# Patient Record
Sex: Female | Born: 1995 | Race: White | Hispanic: No | Marital: Married | Smoking: Former smoker
Health system: Southern US, Community
[De-identification: ages and names within clinical notes are randomized; demographics above are authoritative.]

## PROBLEM LIST (undated history)

## (undated) DIAGNOSIS — K589 Irritable bowel syndrome without diarrhea: Secondary | ICD-10-CM

## (undated) DIAGNOSIS — F329 Major depressive disorder, single episode, unspecified: Secondary | ICD-10-CM

## (undated) DIAGNOSIS — F909 Attention-deficit hyperactivity disorder, unspecified type: Secondary | ICD-10-CM

## (undated) DIAGNOSIS — G8929 Other chronic pain: Secondary | ICD-10-CM

## (undated) DIAGNOSIS — D649 Anemia, unspecified: Secondary | ICD-10-CM

## (undated) DIAGNOSIS — K219 Gastro-esophageal reflux disease without esophagitis: Secondary | ICD-10-CM

## (undated) DIAGNOSIS — D352 Benign neoplasm of pituitary gland: Secondary | ICD-10-CM

## (undated) DIAGNOSIS — K760 Fatty (change of) liver, not elsewhere classified: Secondary | ICD-10-CM

## (undated) DIAGNOSIS — R5383 Other fatigue: Secondary | ICD-10-CM

## (undated) DIAGNOSIS — E669 Obesity, unspecified: Secondary | ICD-10-CM

## (undated) DIAGNOSIS — I10 Essential (primary) hypertension: Secondary | ICD-10-CM

## (undated) DIAGNOSIS — M549 Dorsalgia, unspecified: Secondary | ICD-10-CM

## (undated) DIAGNOSIS — L509 Urticaria, unspecified: Secondary | ICD-10-CM

## (undated) DIAGNOSIS — F419 Anxiety disorder, unspecified: Secondary | ICD-10-CM

## (undated) DIAGNOSIS — R0602 Shortness of breath: Secondary | ICD-10-CM

## (undated) DIAGNOSIS — R519 Headache, unspecified: Secondary | ICD-10-CM

## (undated) DIAGNOSIS — F17200 Nicotine dependence, unspecified, uncomplicated: Secondary | ICD-10-CM

## (undated) DIAGNOSIS — E559 Vitamin D deficiency, unspecified: Secondary | ICD-10-CM

## (undated) DIAGNOSIS — F32A Depression, unspecified: Secondary | ICD-10-CM

## (undated) HISTORY — DX: Gastro-esophageal reflux disease without esophagitis: K21.9

## (undated) HISTORY — DX: Fatty (change of) liver, not elsewhere classified: K76.0

## (undated) HISTORY — DX: Shortness of breath: R06.02

## (undated) HISTORY — DX: Headache, unspecified: R51.9

## (undated) HISTORY — DX: Nicotine dependence, unspecified, uncomplicated: F17.200

## (undated) HISTORY — DX: Vitamin D deficiency, unspecified: E55.9

## (undated) HISTORY — DX: Urticaria, unspecified: L50.9

## (undated) HISTORY — DX: Other fatigue: R53.83

## (undated) HISTORY — DX: Other chronic pain: G89.29

## (undated) HISTORY — DX: Dorsalgia, unspecified: M54.9

## (undated) HISTORY — DX: Irritable bowel syndrome, unspecified: K58.9

## (undated) HISTORY — DX: Benign neoplasm of pituitary gland: D35.2

## (undated) HISTORY — DX: Attention-deficit hyperactivity disorder, unspecified type: F90.9

## (undated) HISTORY — DX: Essential (primary) hypertension: I10

## (undated) HISTORY — DX: Obesity, unspecified: E66.9

---

## 2013-05-29 ENCOUNTER — Emergency Department (HOSPITAL_BASED_OUTPATIENT_CLINIC_OR_DEPARTMENT_OTHER): Payer: 59

## 2013-05-29 ENCOUNTER — Emergency Department (HOSPITAL_BASED_OUTPATIENT_CLINIC_OR_DEPARTMENT_OTHER)
Admission: EM | Admit: 2013-05-29 | Discharge: 2013-05-29 | Disposition: A | Discharge status: Home / Self Care | Payer: 59 | Attending: Emergency Medicine | Admitting: Emergency Medicine

## 2013-05-29 ENCOUNTER — Encounter (HOSPITAL_BASED_OUTPATIENT_CLINIC_OR_DEPARTMENT_OTHER): Payer: Self-pay | Admitting: *Deleted

## 2013-05-29 DIAGNOSIS — IMO0001 Reserved for inherently not codable concepts without codable children: Secondary | ICD-10-CM | POA: Insufficient documentation

## 2013-05-29 DIAGNOSIS — F329 Major depressive disorder, single episode, unspecified: Secondary | ICD-10-CM | POA: Insufficient documentation

## 2013-05-29 DIAGNOSIS — J40 Bronchitis, not specified as acute or chronic: Secondary | ICD-10-CM | POA: Insufficient documentation

## 2013-05-29 DIAGNOSIS — Z3202 Encounter for pregnancy test, result negative: Secondary | ICD-10-CM | POA: Insufficient documentation

## 2013-05-29 DIAGNOSIS — Z79899 Other long term (current) drug therapy: Secondary | ICD-10-CM | POA: Insufficient documentation

## 2013-05-29 DIAGNOSIS — F3289 Other specified depressive episodes: Secondary | ICD-10-CM | POA: Insufficient documentation

## 2013-05-29 DIAGNOSIS — R0982 Postnasal drip: Secondary | ICD-10-CM | POA: Insufficient documentation

## 2013-05-29 DIAGNOSIS — F17200 Nicotine dependence, unspecified, uncomplicated: Secondary | ICD-10-CM | POA: Insufficient documentation

## 2013-05-29 DIAGNOSIS — Z862 Personal history of diseases of the blood and blood-forming organs and certain disorders involving the immune mechanism: Secondary | ICD-10-CM | POA: Insufficient documentation

## 2013-05-29 DIAGNOSIS — F411 Generalized anxiety disorder: Secondary | ICD-10-CM | POA: Insufficient documentation

## 2013-05-29 DIAGNOSIS — J3489 Other specified disorders of nose and nasal sinuses: Secondary | ICD-10-CM | POA: Insufficient documentation

## 2013-05-29 HISTORY — DX: Major depressive disorder, single episode, unspecified: F32.9

## 2013-05-29 HISTORY — DX: Depression, unspecified: F32.A

## 2013-05-29 HISTORY — DX: Anemia, unspecified: D64.9

## 2013-05-29 HISTORY — DX: Anxiety disorder, unspecified: F41.9

## 2013-05-29 LAB — URINALYSIS, ROUTINE W REFLEX MICROSCOPIC
Bilirubin Urine: NEGATIVE
Hgb urine dipstick: NEGATIVE
Ketones, ur: NEGATIVE mg/dL
Leukocytes, UA: NEGATIVE
Nitrite: NEGATIVE
Protein, ur: NEGATIVE mg/dL
Specific Gravity, Urine: 1.007 (ref 1.005–1.030)
pH: 7 (ref 5.0–8.0)

## 2013-05-29 MED ORDER — PREDNISONE 20 MG PO TABS: ORAL_TABLET | ORAL | Status: DC

## 2013-05-29 MED ORDER — HYDROCODONE-HOMATROPINE 5-1.5 MG/5ML PO SYRP: 5.0000 mL | ORAL_SOLUTION | Freq: Four times a day (QID) | ORAL | Status: DC | PRN

## 2013-05-29 MED ORDER — ALBUTEROL SULFATE HFA 108 (90 BASE) MCG/ACT IN AERS
2.0000 | INHALATION_SPRAY | Freq: Once | RESPIRATORY_TRACT | Status: AC
  Administered 2013-05-29: 2 via RESPIRATORY_TRACT
  Filled 2013-05-29: qty 6.7

## 2013-05-29 NOTE — ED Provider Notes (Signed)
CSN: [id number]     Arrival date & time 05/29/13  0142 History   First MD Initiated Contact with Patient 05/29/13 0225     Chief Complaint  Patient presents with  . Cough  . Shortness of Breath   (Consider location/radiation/quality/duration/timing/severity/associated sxs/prior Treatment) HPI Comments: Patient presents with a three-day history of runny nose and nasal congestion. She also has chest congestion and cough which is productive of yellow sputum. She denies any chest pain. She does have some intermittent shortness of breath. She denies any pleuritic-type pain. She denies any leg pain or swelling. She denies any fevers or chills. She denies any history of underlying lung disease.  Patient is a 17 y.o. female presenting with cough and shortness of breath.  Cough Associated symptoms: myalgias, rhinorrhea, shortness of breath and sore throat   Associated symptoms: no chest pain, no chills, no diaphoresis, no fever, no headaches and no rash   Shortness of Breath Associated symptoms: cough and sore throat   Associated symptoms: no abdominal pain, no chest pain, no diaphoresis, no fever, no headaches, no rash and no vomiting     Past Medical History  Diagnosis Date  . Anemia   . Anxiety   . Depression    History reviewed. No pertinent past surgical history. History reviewed. No pertinent family history. History  Substance Use Topics  . Smoking status: Current Every Day Smoker -- 0.50 packs/day    Types: Cigarettes  . Smokeless tobacco: Not on file  . Alcohol Use: Yes     Comment: occasionally   OB History   Grav Para Term Preterm Abortions TAB SAB Ect Mult Living                 Review of Systems  Constitutional: Positive for fatigue. Negative for fever, chills and diaphoresis.  HENT: Positive for congestion, sore throat, rhinorrhea and postnasal drip. Negative for sneezing.   Eyes: Negative.   Respiratory: Positive for cough and shortness of breath. Negative for chest  tightness.   Cardiovascular: Negative for chest pain and leg swelling.  Gastrointestinal: Negative for nausea, vomiting, abdominal pain, diarrhea and blood in stool.  Genitourinary: Negative for frequency, hematuria, flank pain and difficulty urinating.  Musculoskeletal: Positive for myalgias. Negative for back pain and arthralgias.  Skin: Negative for rash.  Neurological: Negative for dizziness, speech difficulty, weakness, numbness and headaches.    Allergies  Review of patient's allergies indicates no known allergies.  Home Medications   Current Outpatient Rx  Name  Route  Sig  Dispense  Refill  . escitalopram (LEXAPRO) 20 MG tablet   Oral   Take 20 mg by mouth daily.         . norethindrone-ethinyl estradiol (TRIPHASIL,CYCLAFEM,ALYACEN) 0.5/0.75/1-35 MG-MCG tablet   Oral   Take 1 tablet by mouth daily.         [name] HYDROcodone-homatropine (HYCODAN) 5-1.5 MG/5ML syrup   Oral   Take 5 mLs by mouth every 6 (six) hours as needed for cough.   120 mL   0   . predniSONE (DELTASONE) 20 MG tablet      3 tabs po day one, then 2 po daily x 4 days   11 tablet   0    BP 136/79  Pulse 97  Temp(Src) 98.7 F (37.1 C) (Oral)  Resp 18  Ht 5\' 4"  (1.626 m)  Wt 172 lb (78.019 kg)  BMI 29.51 kg/m2  SpO2 98%  LMP 05/15/2013 Physical Exam  Constitutional: She is oriented to  person, place, and time. She appears well-developed and well-nourished.  HENT:  Head: Normocephalic and atraumatic.  Right Ear: External ear normal.  Left Ear: External ear normal.  Mouth/Throat: Oropharynx is clear and moist.  Eyes: Pupils are equal, round, and reactive to light.  Neck: Normal range of motion. Neck supple.  Cardiovascular: Normal rate, regular rhythm and normal heart sounds.   Pulmonary/Chest: Effort normal. No respiratory distress. She has wheezes (very mild expiratory wheezing bilaterally). She has no rales. She exhibits no tenderness.  Abdominal: Soft. Bowel sounds are normal. There is  no tenderness. There is no rebound and no guarding.  Musculoskeletal: Normal range of motion. She exhibits no edema.  Lymphadenopathy:    She has no cervical adenopathy.  Neurological: She is alert and oriented to person, place, and time.  Skin: Skin is warm and dry. No rash noted.  Psychiatric: She has a normal mood and affect.    ED Course  Procedures (including critical care time) Labs Review Labs Reviewed  URINALYSIS, ROUTINE W REFLEX MICROSCOPIC  PREGNANCY, URINE   Imaging Review Dg Chest 2 View  05/29/2013   *RADIOLOGY REPORT*  Clinical Data: Cough, short of breath  CHEST - 2 VIEW  Comparison: None.  Findings: The lungs are well-aerated and free from pulmonary edema, focal airspace consolidation or pulmonary nodule.  Cardiac and mediastinal contours are within normal limits.  No pneumothorax, or pleural effusion. No acute osseous findings.  IMPRESSION:  No acute cardiopulmonary disease.   Original Report Authenticated By: Malachy Moan, M.D.    MDM   1. Bronchitis    Patient no evidence of pneumonia. She was treated with albuterol inhaler, steroids and cough syrup. She was advised to followup with her primary care physician or return here if her symptoms are not improving or she has any worsening symptoms.    Rolan Bucco, MD 05/29/13 0500

## 2013-05-29 NOTE — ED Notes (Signed)
Pt reports nasal congestion and productive cough w/ yellow sputum x3 days and body aches, tonight when pt tried to lay down to sleep she became acutely short of breath - pt admits to hx of anxiety and concerned this may have exacerbated her shortness of breath. Pt denies fever or hx of asthma or bronchitis.

## 2017-02-25 ENCOUNTER — Ambulatory Visit (INDEPENDENT_AMBULATORY_CARE_PROVIDER_SITE_OTHER): Payer: PRIVATE HEALTH INSURANCE | Admitting: Family Medicine

## 2017-02-25 ENCOUNTER — Encounter: Payer: Self-pay | Admitting: Family Medicine

## 2017-02-25 VITALS — BP 124/78 | HR 91 | Temp 98.3°F | Ht 65.0 in | Wt 262.2 lb

## 2017-02-25 DIAGNOSIS — F329 Major depressive disorder, single episode, unspecified: Secondary | ICD-10-CM | POA: Diagnosis not present

## 2017-02-25 DIAGNOSIS — F419 Anxiety disorder, unspecified: Secondary | ICD-10-CM | POA: Insufficient documentation

## 2017-02-25 DIAGNOSIS — R635 Abnormal weight gain: Secondary | ICD-10-CM

## 2017-02-25 DIAGNOSIS — E348 Other specified endocrine disorders: Secondary | ICD-10-CM | POA: Diagnosis not present

## 2017-02-25 DIAGNOSIS — R51 Headache: Secondary | ICD-10-CM | POA: Diagnosis not present

## 2017-02-25 DIAGNOSIS — F32A Depression, unspecified: Secondary | ICD-10-CM

## 2017-02-25 DIAGNOSIS — R519 Headache, unspecified: Secondary | ICD-10-CM

## 2017-02-25 DIAGNOSIS — N6452 Nipple discharge: Secondary | ICD-10-CM

## 2017-02-25 DIAGNOSIS — G8929 Other chronic pain: Secondary | ICD-10-CM

## 2017-02-25 DIAGNOSIS — F17200 Nicotine dependence, unspecified, uncomplicated: Secondary | ICD-10-CM

## 2017-02-25 LAB — CBC WITH DIFFERENTIAL/PLATELET
Basophils Absolute: 0 10*3/uL (ref 0.0–0.1)
Basophils Relative: 0.4 % (ref 0.0–3.0)
Eosinophils Absolute: 0.2 10*3/uL (ref 0.0–0.7)
Eosinophils Relative: 2.1 % (ref 0.0–5.0)
HCT: 36 % (ref 36.0–46.0)
Hemoglobin: 11.9 g/dL — ABNORMAL LOW (ref 12.0–15.0)
Lymphocytes Relative: 28.1 % (ref 12.0–46.0)
Lymphs Abs: 2.6 10*3/uL (ref 0.7–4.0)
MCHC: 33.1 g/dL (ref 30.0–36.0)
MCV: 84.3 fl (ref 78.0–100.0)
Monocytes Absolute: 0.5 10*3/uL (ref 0.1–1.0)
Monocytes Relative: 5.4 % (ref 3.0–12.0)
Neutro Abs: 5.9 10*3/uL (ref 1.4–7.7)
Neutrophils Relative %: 64 % (ref 43.0–77.0)
Platelets: 282 10*3/uL (ref 150.0–400.0)
RBC: 4.27 Mil/uL (ref 3.87–5.11)
RDW: 14.6 % (ref 11.5–15.5)
WBC: 9.3 10*3/uL (ref 4.0–10.5)

## 2017-02-25 LAB — COMPREHENSIVE METABOLIC PANEL
ALT: 17 U/L (ref 0–35)
AST: 16 U/L (ref 0–37)
Albumin: 4 g/dL (ref 3.5–5.2)
Alkaline Phosphatase: 96 U/L (ref 39–117)
BUN: 9 mg/dL (ref 6–23)
CO2: 28 mEq/L (ref 19–32)
Calcium: 9.5 mg/dL (ref 8.4–10.5)
Chloride: 102 mEq/L (ref 96–112)
Creatinine, Ser: 0.61 mg/dL (ref 0.40–1.20)
GFR: 129.38 mL/min (ref >60.00)
Glucose, Bld: 83 mg/dL (ref 70–99)
Potassium: 4.3 mEq/L (ref 3.5–5.1)
Sodium: 138 mEq/L (ref 135–145)
Total Bilirubin: 0.3 mg/dL (ref 0.2–1.2)
Total Protein: 7.2 g/dL (ref 6.0–8.3)

## 2017-02-25 LAB — HCG, QUANTITATIVE, PREGNANCY: Quantitative HCG: <0.6 m[IU]/mL

## 2017-02-25 LAB — TSH: TSH: 1.89 u[IU]/mL (ref 0.35–4.50)

## 2017-02-25 LAB — T4, FREE: Free T4: 0.59 ng/dL — ABNORMAL LOW (ref 0.60–1.60)

## 2017-02-25 LAB — LUTEINIZING HORMONE: LH: 3.18 m[IU]/mL

## 2017-02-25 LAB — FOLLICLE STIMULATING HORMONE: FSH: 4.5 m[IU]/mL

## 2017-02-25 NOTE — Progress Notes (Signed)
Veronica Gray is a 21 y.o. female is here to [facility].   Patient Care Team: Briscoe Deutscher, DO as PCP - General (Family Medicine)   History of Present Illness:   Veronica Gray, CMA, acting as scribe for Dr. Juleen China.  Patient states she would like to discuss her headaches and weight gain today.  States she recently moved here from [place].  In [place] she was being evaluated for a possible pituitary adenoma but didn't get to follow through with evaluation before moving.  Headaches occur daily or every other day.  Located right frontal, behind right eye.  No visual disturbances.  She has been gaining weight for the last year.  States that her shoe size increased by 1 size and she grew an inch taller when she was 51-3 years old.  She also is being treated for anxiety and depression with Effexor XR 300 mg daily.  She states she would like to start seeing a psychiatrist to have her depression re-evaluated.  She smokes 4 cigarettes per day, but has not smoked any for the last 3 days.  She is attempting to quit.  No other concerns or complaints today.  Headache   This is a chronic problem. The current episode started more than 1 month ago. The problem occurs daily. The pain is located in the retro-orbital region. The pain does not radiate. The pain quality is similar to prior headaches. Associated symptoms include nausea, phonophobia and photophobia. Pertinent negatives include no dizziness, eye pain, visual change or vomiting. The symptoms are aggravated by bright light and noise. She has tried Excedrin for the symptoms. The treatment provided moderate relief.   Health Maintenance Due  Topic Date Due  . CHLAMYDIA SCREENING  04/29/2009   PMHx, SurgHx, SocialHx, Medications, and Allergies were reviewed in the Visit Navigator and updated as appropriate.   Past Medical History:  Diagnosis Date  . Anemia   . Anxiety   . Current smoker   . Depression   . Hypertension    History  reviewed. No pertinent surgical history.  Family History  Problem Relation Age of Onset  . Hyperlipidemia Father   . Hypertension Father   . Mental illness Maternal Grandmother   . Stroke Maternal Grandfather   . Hyperlipidemia Paternal Grandmother   . Cancer Paternal Grandfather      Social History  Substance Use Topics  . Smoking status: Current Every Day Smoker    Packs/day: 0.50    Types: Cigarettes  . Smokeless tobacco: Never Used  . Alcohol use Yes     Comment: occasionally   Current Medications and Allergies:   .  levonorgestrel (MIRENA) 20 MCG/24HR IUD, 1 each by Intrauterine route once., Disp: , Rfl:  .  venlafaxine XR (EFFEXOR-XR) 150 MG 24 hr capsule, Take 300 mg by mouth daily with breakfast., Disp: , Rfl:   No Known Allergies   Review of Systems:   Review of Systems  Eyes: Positive for photophobia. Negative for pain.  Gastrointestinal: Positive for nausea. Negative for vomiting.  Neurological: Positive for headaches. Negative for dizziness.   Vitals:   Vitals:   02/25/17 0929  BP: 124/78  Pulse: 91  Temp: 98.3 F (36.8 C)  TempSrc: Oral  SpO2: 98%  Weight: 262 lb 3.2 oz (118.9 kg)  Height: 5\' 5"  (1.651 m)     Body mass index is 43.63 kg/m.  Physical Exam:   Physical Exam  Constitutional: She is oriented to person, place, and time. She appears well-developed  and well-nourished. No distress.  HENT:  Head: Normocephalic and atraumatic.  Right Ear: External ear normal.  Left Ear: External ear normal.  Nose: Nose normal.  Mouth/Throat: Oropharynx is clear and moist.  Eyes: Conjunctivae and EOM are normal. Pupils are equal, round, and reactive to light.  Neck: Normal range of motion. Neck supple. No thyromegaly present.  Cardiovascular: Normal rate, regular rhythm, normal heart sounds and intact distal pulses.   Pulmonary/Chest: Effort normal and breath sounds normal.  Abdominal: Soft. Bowel sounds are normal.  Musculoskeletal: Normal range of  motion.  Lymphadenopathy:    She has no cervical adenopathy.  Neurological: She is alert and oriented to person, place, and time. She displays normal reflexes. No cranial nerve deficit or sensory deficit. She exhibits normal muscle tone. Coordination normal.  Skin: Skin is warm.  Striae axilla.  Psychiatric: She has a normal mood and affect. Her behavior is normal.  Nursing note and vitals reviewed.  Assessment and Plan:   Rebie was seen today for establish care, headache and obesity.  Diagnoses and all orders for this visit:  Chronic nonintractable headache, unspecified headache type Comments: Headache with below concerning symptoms. Labs pending . Concern for prolactinoma. Will go ahead and order MRI.  Orders: -     MR BRAIN W WO CONTRAST; Future  Nipple discharge Comments: Milk discharge from both nipples, only when she expresses. G0P0. No OTC medications. No drug use. Pregnancy negative. Orders: -     MR BRAIN W WO CONTRAST; Future -     Prolactin -     ACTH -     TSH -     T4, free -     CBC with Differential/Platelet -     Comprehensive metabolic panel -     Insulin-like growth factor -     Luteinizing hormone -     Follicle stimulating hormone -     Cancel: Beta HCG, Quant -     hCG, quantitative, pregnancy  Weight gain Comments: Patient reports weight gain of over 50 pounds since the beginning of the year. Orders: -     MR BRAIN W WO CONTRAST; Future -     ACTH -     TSH -     T4, free -     CBC with Differential/Platelet -     Comprehensive metabolic panel -     Insulin-like growth factor -     Luteinizing hormone -     Follicle stimulating hormone -     Cancel: Beta HCG, Quant -     hCG, quantitative, pregnancy  Depression, unspecified depression type Comments: The pateint has been fairly stable on Effexor. She is concerned that she may be cycling and would like evaluation by Psychiatry. Will refer. Orders: -     Ambulatory referral to  Psychiatry  Growth disorder Comments: Shoe size has increased one size and patient reports height increase by one inch. Orders: -     MR BRAIN W WO CONTRAST; Future -     ACTH -     TSH -     T4, free -     CBC with Differential/Platelet -     Comprehensive metabolic panel -     Insulin-like growth factor -     Luteinizing hormone -     Follicle stimulating hormone -     Cancel: Beta HCG, Quant  Smoker Comments: I advised patient to quit smoking, and offered support. [place] QUITLINE: 1-800-QUIT-NOW [phone]).     [name]  Reviewed expectations re: course of current medical issues. . Discussed self-management of symptoms. . Outlined signs and symptoms indicating need for more acute intervention. . Patient verbalized understanding and all questions were answered. [name] Health Maintenance issues including appropriate healthy diet, exercise, and smoking avoidance were discussed with patient. . See orders for this visit as documented in the electronic medical record. . Patient received an After Visit Summary.  CMA served as Education administrator during this visit. History, Physical, and Plan performed by medical provider. The above documentation has been reviewed and is accurate and complete. Briscoe Deutscher, D.O.  Briscoe Deutscher, DO [place] 02/27/2017  No future appointments.

## 2017-02-26 LAB — PROLACTIN: Prolactin: 10 ng/mL

## 2017-02-27 DIAGNOSIS — F17200 Nicotine dependence, unspecified, uncomplicated: Secondary | ICD-10-CM | POA: Insufficient documentation

## 2017-02-28 LAB — ACTH: C206 ACTH: 17 pg/mL (ref 6–50)

## 2017-03-01 LAB — INSULIN-LIKE GROWTH FACTOR
IGF-I, LC/MS: 95 ng/mL (ref 83–456)
Z-Score (Female): -1.8 SD (ref ?–2.0)

## 2017-03-18 ENCOUNTER — Ambulatory Visit
Admission: RE | Admit: 2017-03-18 | Discharge: 2017-03-18 | Disposition: A | Discharge status: Home / Self Care | Payer: PRIVATE HEALTH INSURANCE | Source: Ambulatory Visit | Attending: Family Medicine | Admitting: Family Medicine

## 2017-03-18 DIAGNOSIS — R519 Headache, unspecified: Secondary | ICD-10-CM

## 2017-03-18 DIAGNOSIS — R51 Headache: Principal | ICD-10-CM

## 2017-03-18 DIAGNOSIS — R635 Abnormal weight gain: Secondary | ICD-10-CM

## 2017-03-18 DIAGNOSIS — N6452 Nipple discharge: Secondary | ICD-10-CM

## 2017-03-18 DIAGNOSIS — E348 Other specified endocrine disorders: Secondary | ICD-10-CM

## 2017-03-18 DIAGNOSIS — G8929 Other chronic pain: Secondary | ICD-10-CM

## 2017-03-18 MED ORDER — GADOBENATE DIMEGLUMINE 529 MG/ML IV SOLN: 10.0000 mL | Freq: Once | INTRAVENOUS | Status: DC | PRN

## 2017-04-08 ENCOUNTER — Ambulatory Visit: Payer: Self-pay | Admitting: Family Medicine

## 2017-04-26 ENCOUNTER — Telehealth: Payer: Self-pay | Admitting: Family Medicine

## 2017-04-26 ENCOUNTER — Other Ambulatory Visit: Payer: Self-pay

## 2017-04-26 DIAGNOSIS — D352 Benign neoplasm of pituitary gland: Secondary | ICD-10-CM

## 2017-04-26 NOTE — Telephone Encounter (Signed)
Patient calling to advise she needed referral placed to Neurosurgery. I can see under last MRI results, just need referral placed then I will send to [facility].

## 2017-04-26 NOTE — Telephone Encounter (Signed)
Referral to neurosurgery has been placed.  We do not place referrals to psych.  Patient needs to call psych on her own.

## 2017-04-26 NOTE — Telephone Encounter (Signed)
Patient called inquiring about her psych referral, transferred the call to Lanny Hurst to further advise.

## 2017-05-20 ENCOUNTER — Ambulatory Visit (INDEPENDENT_AMBULATORY_CARE_PROVIDER_SITE_OTHER): Payer: PRIVATE HEALTH INSURANCE | Admitting: Family Medicine

## 2017-05-20 ENCOUNTER — Encounter: Payer: Self-pay | Admitting: Family Medicine

## 2017-05-20 VITALS — BP 132/78 | HR 106 | Temp 98.6°F | Ht 65.0 in | Wt 264.4 lb

## 2017-05-20 DIAGNOSIS — F17200 Nicotine dependence, unspecified, uncomplicated: Secondary | ICD-10-CM | POA: Diagnosis not present

## 2017-05-20 DIAGNOSIS — F331 Major depressive disorder, recurrent, moderate: Secondary | ICD-10-CM | POA: Diagnosis not present

## 2017-05-20 DIAGNOSIS — Z23 Encounter for immunization: Secondary | ICD-10-CM | POA: Diagnosis not present

## 2017-05-20 DIAGNOSIS — F909 Attention-deficit hyperactivity disorder, unspecified type: Secondary | ICD-10-CM | POA: Diagnosis not present

## 2017-05-20 DIAGNOSIS — D352 Benign neoplasm of pituitary gland: Secondary | ICD-10-CM | POA: Diagnosis not present

## 2017-05-20 DIAGNOSIS — F988 Other specified behavioral and emotional disorders with onset usually occurring in childhood and adolescence: Secondary | ICD-10-CM | POA: Insufficient documentation

## 2017-05-20 DIAGNOSIS — O926 Galactorrhea: Secondary | ICD-10-CM | POA: Diagnosis not present

## 2017-05-20 DIAGNOSIS — N643 Galactorrhea not associated with childbirth: Secondary | ICD-10-CM

## 2017-05-20 MED ORDER — AMPHETAMINE-DEXTROAMPHETAMINE 10 MG PO TABS: 10.0000 mg | ORAL_TABLET | Freq: Two times a day (BID) | ORAL | 0 refills | Status: DC

## 2017-05-20 MED ORDER — VENLAFAXINE HCL ER 150 MG PO CP24: 300.0000 mg | ORAL_CAPSULE | Freq: Every day | ORAL | 0 refills | Status: DC

## 2017-05-20 NOTE — Progress Notes (Signed)
Veronica Gray is a 21 y.o. female is here for follow up.  History of Present Illness:   Water quality scientist, CMA, acting as scribe for Dr. Juleen China.  HPI:  Patient comes in today for a follow up visit.  States she would like to change her medication from Effexor XR 300 mg daily to "something else".  She states she thinks Effexor is making her gain weight.  She also says if she does not take the Effexor at the same time everyday she gets vertigo and nausea.  She states she will be changing insurance companies soon and will be without insurance for about a month and would like a 90 day supply of her medication to cover the gap.  Patient went to see neurosurgery and states they told her they "don't know what is wrong with her".  She says they have her a prescription to have her cortisol level checked.  She would like to get a flu shot today.  There are no preventive care reminders to display for this patient. Depression screen [facility] 2/9 02/25/2017 02/25/2017  Decreased Interest 0 0  Down, Depressed, Hopeless 1 1  PHQ - 2 Score 1 1  Altered sleeping 0 -  Tired, decreased energy 0 -  Change in appetite 0 -  Feeling bad or failure about yourself  0 -  Trouble concentrating 0 -  Moving slowly or fidgety/restless 0 -  Suicidal thoughts 0 -  PHQ-9 Score 1 -   PMHx, SurgHx, SocialHx, FamHx, Medications, and Allergies were reviewed in the Visit Navigator and updated as appropriate.   Patient Active Problem List   Diagnosis Date Noted  . Galactorrhea 05/20/2017  . Pituitary adenoma ([place]) 05/20/2017  . ADD (attention deficit disorder) 05/20/2017  . Smoker 02/27/2017  . Headache 02/25/2017  . Nipple discharge 02/25/2017  . Weight gain 02/25/2017  . Depression 02/25/2017  . Anxiety 02/25/2017  . Growth disorder 02/25/2017   Social History  Substance Use Topics  . Smoking status: Current Every Day Smoker    Packs/day: 0.50    Types: Cigarettes  . Smokeless tobacco: Never Used  . Alcohol use Yes       Comment: occasionally   Current Medications and Allergies:   .  levonorgestrel (MIRENA) 20 MCG/24HR IUD, 1 each by Intrauterine route once., Disp: , Rfl:  .  venlafaxine XR (EFFEXOR-XR) 150 MG 24 hr capsule, Take 2 capsules (300 mg total) by mouth daily with breakfast., Disp: 90 capsule, Rfl: 0  No Known Allergies   Review of Systems   Pertinent items are noted in the HPI. Otherwise, ROS is negative.  Vitals:   Vitals:   05/20/17 1101  BP: 132/78  Pulse: (!) 106  Temp: 98.6 F (37 C)  TempSrc: Oral  SpO2: 98%  Weight: 264 lb 6.4 oz (119.9 kg)  Height: 5\' 5"  (1.651 m)     Body mass index is 44 kg/m.   Physical Exam:   Physical Exam  Constitutional: She appears well-nourished.  HENT:  Head: Normocephalic and atraumatic.  Eyes: Pupils are equal, round, and reactive to light. EOM are normal.  Neck: Normal range of motion. Neck supple.  Cardiovascular: Normal rate, regular rhythm, normal heart sounds and intact distal pulses.   Pulmonary/Chest: Effort normal.  Abdominal: Soft.  Skin: Skin is warm.  Psychiatric: She has a normal mood and affect. Her behavior is normal.  Nursing note and vitals reviewed.   Results for orders placed or performed in visit on 02/25/17  Prolactin  Result Value Ref Range   Prolactin 10.0 ng/mL  ACTH  Result Value Ref Range   C206 ACTH 17 6 - 50 pg/mL  TSH  Result Value Ref Range   TSH 1.89 0.35 - 4.50 uIU/mL  T4, free  Result Value Ref Range   Free T4 0.59 (L) 0.60 - 1.60 ng/dL  CBC with Differential/Platelet  Result Value Ref Range   WBC 9.3 4.0 - 10.5 K/uL   RBC 4.27 3.87 - 5.11 Mil/uL   Hemoglobin 11.9 (L) 12.0 - 15.0 g/dL   HCT 36.0 36.0 - 46.0 %   MCV 84.3 78.0 - 100.0 fl   MCHC 33.1 30.0 - 36.0 g/dL   RDW 14.6 11.5 - 15.5 %   Platelets 282.0 150.0 - 400.0 K/uL   Neutrophils Relative % 64.0 43.0 - 77.0 %   Lymphocytes Relative 28.1 12.0 - 46.0 %   Monocytes Relative 5.4 3.0 - 12.0 %   Eosinophils Relative 2.1 0.0 -  5.0 %   Basophils Relative 0.4 0.0 - 3.0 %   Neutro Abs 5.9 1.4 - 7.7 K/uL   Lymphs Abs 2.6 0.7 - 4.0 K/uL   Monocytes Absolute 0.5 0.1 - 1.0 K/uL   Eosinophils Absolute 0.2 0.0 - 0.7 K/uL   Basophils Absolute 0.0 0.0 - 0.1 K/uL  Comprehensive metabolic panel  Result Value Ref Range   Sodium 138 135 - 145 mEq/L   Potassium 4.3 3.5 - 5.1 mEq/L   Chloride 102 96 - 112 mEq/L   CO2 28 19 - 32 mEq/L   Glucose, Bld 83 70 - 99 mg/dL   BUN 9 6 - 23 mg/dL   Creatinine, Ser 0.61 0.40 - 1.20 mg/dL   Total Bilirubin 0.3 0.2 - 1.2 mg/dL   Alkaline Phosphatase 96 39 - 117 U/L   AST 16 0 - 37 U/L   ALT 17 0 - 35 U/L   Total Protein 7.2 6.0 - 8.3 g/dL   Albumin 4.0 3.5 - 5.2 g/dL   Calcium 9.5 8.4 - 10.5 mg/dL   GFR 129.38 >60.00 mL/min  Insulin-like growth factor  Result Value Ref Range   IGF-I, LC/MS 95 83 - 456 ng/mL   Z-Score (Female) -1.8 -2.0 - 2.0 SD  Luteinizing hormone  Result Value Ref Range   LH 7.06 mIU/mL  Follicle stimulating hormone  Result Value Ref Range   FSH 4.5 mIU/ML  hCG, quantitative, pregnancy  Result Value Ref Range   Quantitative HCG <0.60 mIU/ml   Assessment and Plan:   Veronica Gray was seen today for follow-up.  Diagnoses and all orders for this visit:  Moderate episode of recurrent major depressive disorder ([place]) Comments: Will continue Effexor at currect dose. She understands the concern for dicontinuation syndrome. I encouraged that she establish with a Psychiatrist. No SI/HI.  Orders: -     venlafaxine XR (EFFEXOR-XR) 150 MG 24 hr capsule; Take 2 capsules (300 mg total) by mouth daily with breakfast.  Galactorrhea Comments: With patient's symptoms and MRI microadenoma, will ask Endocrine to evaluate and follow. Cortisol level Rx in hand.  Orders: -     Ambulatory referral to Endocrinology  Pituitary adenoma [facility]) Comments: On MRI. Neurosurgery records will need to be requested. Orders: -     Ambulatory referral to Endocrinology  Attention  deficit hyperactivity disorder (ADHD), unspecified ADHD type Comments: The patient brought this up today. She states that she used  To be on Adderall and it seemed to help her ADD as well as depression. She  stopped last year due to elevated BP. Since then, she stopped Advil and Melatonin. Her BP has normalized. HR is usually 80s-90s. Requesting records. Okay one month supply. Orders: -     amphetamine-dextroamphetamine (ADDERALL) 10 MG tablet; Take 1 tablet (10 mg total) by mouth 2 (two) times daily.  Need for immunization against influenza -     Flu Vaccine QUAD 36+ mos IM  Smoker Comments: Precontemplative stage. I advised patient to quit smoking, and offered support. [place] QUITLINE: 1-800-QUIT-NOW [phone]).   . Reviewed expectations re: course of current medical issues. . Discussed self-management of symptoms. . Outlined signs and symptoms indicating need for more acute intervention. . Patient verbalized understanding and all questions were answered. [name] Health Maintenance issues including appropriate healthy diet, exercise, and smoking avoidance were discussed with patient. . See orders for this visit as documented in the electronic medical record. . Patient received an After Visit Summary.  CMA served as Education administrator during this visit. History, Physical, and Plan performed by medical provider. The above documentation has been reviewed and is accurate and complete. Briscoe Deutscher, D.O.  Briscoe Deutscher, DO [place] 05/21/2017

## 2017-06-29 ENCOUNTER — Encounter: Payer: Self-pay | Admitting: Endocrinology

## 2017-06-29 ENCOUNTER — Ambulatory Visit: Payer: 59 | Admitting: Endocrinology

## 2017-06-29 VITALS — BP 124/78 | HR 96 | Wt 267.4 lb

## 2017-06-29 DIAGNOSIS — D352 Benign neoplasm of pituitary gland: Secondary | ICD-10-CM | POA: Diagnosis not present

## 2017-06-29 LAB — T4, FREE: Free T4: 0.7 ng/dL (ref 0.60–1.60)

## 2017-06-29 LAB — TSH: TSH: 1.84 u[IU]/mL (ref 0.35–4.50)

## 2017-06-29 LAB — CORTISOL: CORTISOL PLASMA: 4.3 ug/dL

## 2017-06-29 MED ORDER — BROMOCRIPTINE MESYLATE 2.5 MG PO TABS: 1.2500 mg | ORAL_TABLET | Freq: Every day | ORAL | 11 refills | Status: DC

## 2017-06-29 MED ORDER — DEXAMETHASONE 1 MG PO TABS: ORAL_TABLET | ORAL | 0 refills | Status: DC

## 2017-06-29 NOTE — Patient Instructions (Addendum)
I have sent a prescription to your pharmacy, to stop the drainage from the breasts. You should do a "dexamethasone suppression test."  For this, you would take dexamethasone 1 mg at 10 pm (I have sent a prescription to your pharmacy), then come in for a "cortisol" blood test the next morning before 9 am.  You do not need to be fasting for this test. Please redo the MRI in 6 months, as scheduled. Please come back for a follow-up appointment in 1 year.

## 2017-06-29 NOTE — Progress Notes (Signed)
Subjective:    Patient ID: Veronica Gray, female    DOB: 1995/11/20, 21 y.o.   MRN: [id number]  HPI Pt is referred by Dr Juleen China, for galactorrhea.  Pt had menarche at age 26.  She has always had irregular and heavy menses. She took oral contraceptives from age 68 until last year, when she got IUD.  She is G0.  She does not want a pregnancy now, but would like to preserve fertility for the future.  She denies the following: excessive exercise, opiates, antipsychotics, brain XRT, brain surgery, thyroid dz, and seizures.  Pt denies h/o h/o liver dz, XRT, infertility, PCO, renal dz, zoster, or chest wall injury.   She has 9 mos of slight galactorrhea from both breasts, and assoc pain.  Past Medical History:  Diagnosis Date  . Anemia   . Anxiety   . Current smoker   . Depression   . Hypertension     No past surgical history on file.  Social History   Socioeconomic History  . Marital status: Married    Spouse name: Not on file  . Number of children: Not on file  . Years of education: Not on file  . Highest education level: Not on file  Social Needs  . Financial resource strain: Not on file  . Food insecurity - worry: Not on file  . Food insecurity - inability: Not on file  . Transportation needs - medical: Not on file  . Transportation needs - non-medical: Not on file  Occupational History  . Not on file  Tobacco Use  . Smoking status: Current Every Day Smoker    Packs/day: 0.50    Types: Cigarettes  . Smokeless tobacco: Never Used  Substance and Sexual Activity  . Alcohol use: Yes    Comment: occasionally  . Drug use: No  . Sexual activity: Yes    Birth control/protection: Pill  Other Topics Concern  . Not on file  Social History Narrative  . Not on file    Current Outpatient Medications on File Prior to Visit  Medication Sig Dispense Refill  . amphetamine-dextroamphetamine (ADDERALL) 10 MG tablet Take 1 tablet (10 mg total) by mouth 2 (two) times daily. 60 tablet 0    . levonorgestrel (MIRENA) 20 MCG/24HR IUD 1 each by Intrauterine route once.    . venlafaxine XR (EFFEXOR-XR) 150 MG 24 hr capsule Take 2 capsules (300 mg total) by mouth daily with breakfast. 90 capsule 0   No current facility-administered medications on file prior to visit.     No Known Allergies  Family History  Problem Relation Age of Onset  . Hyperlipidemia Father   . Hypertension Father   . Mental illness Maternal Grandmother   . Stroke Maternal Grandfather   . Hyperlipidemia Paternal Grandmother   . Cancer Paternal Grandfather   . Other Neg Hx        galactorrhea    BP 124/78 (BP Location: Left Arm, Patient Position: Sitting, Cuff Size: Large)   Pulse 96   Wt 267 lb 6.4 oz (121.3 kg)   SpO2 97%   BMI 44.50 kg/m     Review of Systems Denies hirsutism, sob, excessive sweating, hair loss on the head, diplopia, depression, edema, cold intolerance, numbness, voice change, fatigue, insomnia, change in skin tone, and easy bruising.  She has pelvic cramps, dry skin, weight gain and intermitt headache.  She still has irreg menses.    Objective:   Physical Exam VS: see vs page GEN: no  distress HEAD: head: no deformity eyes: no periorbital swelling, no proptosis external nose and ears are normal mouth: no lesion seen NECK: supple, thyroid is not enlarged CHEST WALL: no deformity LUNGS: clear to auscultation CV: reg rate and rhythm, no murmur ABD: abdomen is soft, nontender.  no hepatosplenomegaly.  not distended.  no hernia MUSCULOSKELETAL: muscle bulk and strength are grossly normal.  no obvious joint swelling.  gait is normal and steady EXTEMITIES: no deformity.  no ulcer on the feet.  feet are of normal color and temp.  No leg edema PULSES: dorsalis pedis intact bilat.  no carotid bruit NEURO:  cn 2-12 grossly intact.   readily moves all 4's.  sensation is intact to touch on the feet SKIN:  Normal texture and temperature.  No rash or suspicious lesion is visible.  No  striae.  NODES:  None palpable at the neck PSYCH: alert, well-oriented.  Does not appear anxious nor depressed.   I have reviewed outside records, and summarized: Pt was noted to have galactorrhea, and referred here.  She was alos noted to have small pituitary adenoma.  Other probs addressed were ADHD and depression  Prolactin=10  Lab Results  Component Value Date   TSH 1.84 06/29/2017    MRI: Suspected pituitary microadenoma within the right aspect of the gland measuring 6 x 5 mm. No mass effect on the optic chiasm.  Otherwise normal MRI of the brain.    Assessment & Plan:  Pituitary microadenoma, new Galactorrhea, with normal prolactin.  Could be due to adderall.  Parlodel will prob still help  Patient Instructions  I have sent a prescription to your pharmacy, to stop the drainage from the breasts. You should do a "dexamethasone suppression test."  For this, you would take dexamethasone 1 mg at 10 pm (I have sent a prescription to your pharmacy), then come in for a "cortisol" blood test the next morning before 9 am.  You do not need to be fasting for this test. Please redo the MRI in 6 months, as scheduled. Please come back for a follow-up appointment in 1 year.

## 2017-07-03 LAB — INSULIN-LIKE GROWTH FACTOR
IGF-I, LC/MS: 120 ng/mL (ref 83–456)
Z-Score (Female): -1.2 SD (ref ?–2.0)

## 2017-07-08 ENCOUNTER — Ambulatory Visit: Payer: Self-pay | Admitting: Family Medicine

## 2017-07-20 ENCOUNTER — Encounter: Payer: Self-pay | Admitting: Family Medicine

## 2017-07-20 ENCOUNTER — Ambulatory Visit: Payer: Self-pay | Admitting: Family Medicine

## 2017-07-20 ENCOUNTER — Ambulatory Visit: Payer: PRIVATE HEALTH INSURANCE | Admitting: Family Medicine

## 2017-07-20 VITALS — BP 116/74 | HR 89 | Temp 98.1°F | Ht 65.0 in | Wt 271.4 lb

## 2017-07-20 DIAGNOSIS — F331 Major depressive disorder, recurrent, moderate: Secondary | ICD-10-CM

## 2017-07-20 DIAGNOSIS — F17200 Nicotine dependence, unspecified, uncomplicated: Secondary | ICD-10-CM

## 2017-07-20 DIAGNOSIS — N643 Galactorrhea not associated with childbirth: Secondary | ICD-10-CM

## 2017-07-20 NOTE — Progress Notes (Addendum)
Veronica Gray is a 21 y.o. female is here for follow up.  History of Present Illness:   HPI:   1. Moderate episode of recurrent major depressive disorder ([place]). Depression  Depression symptoms: depressed mood, change in sleep, loss of energy Current psychosocial stressors include: none.   Treatment to date has included Medication.  Symptoms have been unchanged since onset of treatment.  Side effects from the treatment include: drowsiness.   Alcohol use: no.  Drug use: no.  Exercise: no.   Patient denies current suicidal and homicidal ideation.    2. Galactorrhea. Medication prescribed by Endocrine but she has not picked it up. Asking for new Rx.     3. Smoker. Not ready to quit.   There are no preventive care reminders to display for this patient. Depression screen [facility] 2/9 02/25/2017 02/25/2017  Decreased Interest 0 0  Down, Depressed, Hopeless 1 1  PHQ - 2 Score 1 1  Altered sleeping 0 -  Tired, decreased energy 0 -  Change in appetite 0 -  Feeling bad or failure about yourself  0 -  Trouble concentrating 0 -  Moving slowly or fidgety/restless 0 -  Suicidal thoughts 0 -  PHQ-9 Score 1 -   PMHx, SurgHx, SocialHx, FamHx, Medications, and Allergies were reviewed in the Visit Navigator and updated as appropriate.   Patient Active Problem List   Diagnosis Date Noted  . Galactorrhea 05/20/2017  . Pituitary adenoma ([place]) 05/20/2017  . ADD (attention deficit disorder) 05/20/2017  . Smoker 02/27/2017  . Headache 02/25/2017  . Nipple discharge 02/25/2017  . Weight gain 02/25/2017  . Depression 02/25/2017  . Anxiety 02/25/2017  . 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 Smoking status: Current Every Day Smoker    Packs/day: 0.50    Types: Cigarettes  . Smokeless tobacco: Never Used  Substance Use Topics  . Alcohol use: Yes    Comment: occasionally  . Drug use: No   Current Medications and Allergies:   .  amphetamine-dextroamphetamine (ADDERALL)  10 MG tablet, Take 1 tablet (10 mg total) by mouth 2 (two) times daily. [name]  dexamethasone (DECADRON) 1 MG tablet, Take a t 9-10 PM< the night before blood test, Disp: 1 tablet, Rfl: 0 .  levonorgestrel (MIRENA) 20 MCG/24HR IUD, 1 each by Intrauterine route once., Disp: , Rfl:  .  venlafaxine XR (EFFEXOR-XR) 150 MG 24 hr capsule, Take 2 capsules (300 mg total) by mouth daily with breakfast., Disp: 90 capsule, Rfl: 0  No Known Allergies   Review of Systems   Pertinent items are noted in the HPI. Otherwise, ROS is negative.  Vitals:   Vitals:   07/20/17 1103  BP: 116/74  Pulse: 89  Temp: 98.1 F (36.7 C)  TempSrc: Oral  SpO2: 99%  Weight: 271 lb 6.4 oz (123.1 kg)  Height: 5\' 5"  (1.651 m)     Body mass index is 45.16 kg/m.   Physical Exam:   Physical Exam  Constitutional: She appears well-nourished.  HENT:  Head: Normocephalic and atraumatic.  Eyes: EOM are normal. Pupils are equal, round, and reactive to light.  Neck: Normal range of motion. Neck supple.  Cardiovascular: Normal rate, regular rhythm, normal heart sounds and intact distal pulses.  Pulmonary/Chest: Effort normal.  Abdominal: Soft.  Skin: Skin is warm.  Psychiatric: She has a normal mood and affect. Her behavior is normal.  Nursing note and vitals reviewed.  Assessment and Plan:   Diagnoses and all  orders for this visit:  Galactorrhea Comments: Okay to re-prescribe medication below.  Orders: -     bromocriptine (PARLODEL) 2.5 MG tablet; Take 0.5 tablets (1.25 mg total) by mouth at bedtime.  Moderate episode of recurrent major depressive disorder ([place]) Comments: See AVS for weaning protocol. Discussed expectations and risks. She is calling Psychiatry for an evaluation. Orders: -     venlafaxine XR (EFFEXOR XR) 75 MG 24 hr capsule; Take 1 capsule (75 mg total) by mouth daily with breakfast.  Smoker Comments: I advised patient to quit smoking, and offered support. [place] QUITLINE: 1-800-QUIT-NOW  [phone]).   . Reviewed expectations re: course of current medical issues. . Discussed self-management of symptoms. . Outlined signs and symptoms indicating need for more acute intervention. . Patient verbalized understanding and all questions were answered. [name] Health Maintenance issues including appropriate healthy diet, exercise, and smoking avoidance were discussed with patient. . See orders for this visit as documented in the electronic medical record. . Patient received an After Visit Summary.  Briscoe Deutscher, DO , [facility] [place] [facility] 07/21/2017  Future Appointments  Date Time Provider [place]  10/20/2017  8:00 AM Briscoe Deutscher, DO [facility]  06/29/2018  8:45 AM Renato Shin, MD [facility] None

## 2017-07-20 NOTE — Patient Instructions (Signed)
Weaning Effexor:  150 MG + 75 MG CAPSULES DAILY X 2 WEEKS, THEN 150 MG CAPSULES X 2 WEEKS, THEN 75 MG CAPSULES X 2 WEEKS, THEN ONE 75 MG CAPSULE EVERY OTHER DAY, THEN OFF

## 2017-07-21 MED ORDER — VENLAFAXINE HCL ER 75 MG PO CP24: 75.0000 mg | ORAL_CAPSULE | Freq: Every day | ORAL | 0 refills | Status: DC

## 2017-07-21 MED ORDER — BROMOCRIPTINE MESYLATE 2.5 MG PO TABS: 1.2500 mg | ORAL_TABLET | Freq: Every day | ORAL | 11 refills | Status: DC

## 2017-08-14 ENCOUNTER — Other Ambulatory Visit: Payer: Self-pay | Admitting: Family Medicine

## 2017-08-14 DIAGNOSIS — F331 Major depressive disorder, recurrent, moderate: Secondary | ICD-10-CM

## 2017-09-16 ENCOUNTER — Other Ambulatory Visit: Payer: Self-pay | Admitting: Family Medicine

## 2017-09-16 DIAGNOSIS — F331 Major depressive disorder, recurrent, moderate: Secondary | ICD-10-CM

## 2017-10-19 ENCOUNTER — Other Ambulatory Visit: Payer: Self-pay | Admitting: Family Medicine

## 2017-10-19 DIAGNOSIS — F331 Major depressive disorder, recurrent, moderate: Secondary | ICD-10-CM

## 2017-10-20 ENCOUNTER — Ambulatory Visit: Payer: 59 | Admitting: Family Medicine

## 2017-10-20 ENCOUNTER — Encounter: Payer: Self-pay | Admitting: Family Medicine

## 2017-10-20 VITALS — BP 120/64 | HR 96 | Temp 97.7°F | Wt 273.8 lb

## 2017-10-20 DIAGNOSIS — F331 Major depressive disorder, recurrent, moderate: Secondary | ICD-10-CM | POA: Diagnosis not present

## 2017-10-20 DIAGNOSIS — N643 Galactorrhea not associated with childbirth: Secondary | ICD-10-CM | POA: Diagnosis not present

## 2017-10-20 DIAGNOSIS — F17200 Nicotine dependence, unspecified, uncomplicated: Secondary | ICD-10-CM | POA: Diagnosis not present

## 2017-10-20 DIAGNOSIS — F909 Attention-deficit hyperactivity disorder, unspecified type: Secondary | ICD-10-CM

## 2017-10-20 MED ORDER — AMPHETAMINE-DEXTROAMPHETAMINE 10 MG PO TABS: 10.0000 mg | ORAL_TABLET | Freq: Two times a day (BID) | ORAL | 0 refills | Status: DC

## 2017-10-20 MED ORDER — BROMOCRIPTINE MESYLATE 2.5 MG PO TABS: 1.2500 mg | ORAL_TABLET | Freq: Every day | ORAL | 11 refills | Status: DC

## 2017-10-20 NOTE — Progress Notes (Signed)
Veronica Gray is a 22 y.o. female is here for follow up.  History of Present Illness:   [name], CMA acting as scribe for Dr. Briscoe Deutscher.   HPI: Patient in for follow up regarding medication refills. She is also concerned about continued weight gain. She admits that she has not been watching diet as well as she has in the past. Work has slowed down and would like to start working out more now.   1. Moderate episode of recurrent major depressive disorder ([place]). Controlled on Effexor 150 plus 75 mg po daily.  Patient states that she has been too busy at work to think about going down the medication.  She has not contacted psychiatry at the continues to plan to do so.  No suicidal thoughts.  She feels that she is in a good place.  She and her husband just bought a house.   2. Galactorrhea.  Controlled with bromocriptine.  Headaches have decreased.  She has been using blue light blocking glasses.   3. Attention deficit hyperactivity disorder (ADHD), unspecified ADHD type.  See below.  Continues to take Adderall 3 times per week.   4. Smoker.  Still smoking.  Not interested in quitting at this time.   Since the last visit has the patient had any:  Appetite changes? No Unintentional weight loss? No Is medication working well ? Yes Does patient take drug holidays? Yes Difficulties falling to sleep or maintaining sleep? No Any anxiety?  Yes Any cardiac issues (fainting or paliptations)? No Suicidal thoughts? No Changes in health since last visit? No New medications? No Any illicit substance abuse? No Has the patient taken his medication today? Yes   Review of Systems  Constitutional: Negative for chills and fever.  HENT: Negative for hearing loss.   Eyes: Negative for blurred vision and double vision.  Respiratory: Negative for cough.   Cardiovascular: Negative for chest pain and palpitations.  Gastrointestinal: Negative for heartburn and nausea.  Genitourinary: Negative  for dysuria and urgency.  Neurological: Negative for dizziness and headaches.  Endo/Heme/Allergies: Does not bruise/bleed easily.   There are no preventive care reminders to display for this patient. Depression screen [facility] 2/9 10/20/2017 02/25/2017 02/25/2017  Decreased Interest 0 0 0  Down, Depressed, Hopeless 1 1 1   PHQ - 2 Score 1 1 1   Altered sleeping 1 0 -  Tired, decreased energy 2 0 -  Change in appetite 1 0 -  Feeling bad or failure about yourself  0 0 -  Trouble concentrating 0 0 -  Moving slowly or fidgety/restless 0 0 -  Suicidal thoughts 0 0 -  PHQ-9 Score 5 1 -  Difficult doing work/chores Somewhat difficult - -   PMHx, SurgHx, SocialHx, FamHx, Medications, and Allergies were reviewed in the Visit Navigator and updated as appropriate.   Patient Active Problem List   Diagnosis Date Noted  . Galactorrhea 05/20/2017  . Pituitary adenoma ([place]) 05/20/2017  . ADD (attention deficit disorder) 05/20/2017  . Smoker 02/27/2017  . Headache 02/25/2017  . Nipple discharge 02/25/2017  . Weight gain 02/25/2017  . Depression 02/25/2017  . Anxiety 02/25/2017  . 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 Smoking status: Current Every Day Smoker    Packs/day: 0.50    Types: Cigarettes  . Smokeless tobacco: Never Used  Substance Use Topics  . Alcohol use: Yes    Comment: occasionally  . Drug use: No   Current Medications and Allergies:  Current Outpatient Medications:  .  amphetamine-dextroamphetamine (ADDERALL) 10 MG tablet, Take 1 tablet (10 mg total) by mouth 2 (two) times daily., Disp: 180 tablet, Rfl: 0 .  bromocriptine (PARLODEL) 2.5 MG tablet, Take 0.5 tablets (1.25 mg total) by mouth at bedtime., Disp: 15 tablet, Rfl: 11 .  levonorgestrel (MIRENA) 20 MCG/24HR IUD, 1 each by Intrauterine route once., Disp: , Rfl:  .  venlafaxine XR (EFFEXOR-XR) 150 MG 24 hr capsule, Take 2 capsules (300 mg total) by mouth daily with breakfast., Disp: 90 capsule, Rfl:  0 .  venlafaxine XR (EFFEXOR-XR) 75 MG 24 hr capsule, TAKE 1 CAPSULE(75 MG) BY MOUTH DAILY WITH BREAKFAST, Disp: 30 capsule, Rfl: 3  No Known Allergies   Review of Systems   Pertinent items are noted in the HPI. Otherwise, ROS is negative.  Vitals:   Vitals:   10/20/17 0809  BP: 120/64  Pulse: 96  Temp: 97.7 F (36.5 C)  TempSrc: Oral  SpO2: 99%  Weight: 273 lb 12.8 oz (124.2 kg)     Body mass index is 45.56 kg/m.  Physical Exam:   Physical Exam  Constitutional: She is oriented to person, place, and time. She appears well-developed and well-nourished. No distress.  HENT:  Head: Normocephalic and atraumatic.  Right Ear: External ear normal.  Left Ear: External ear normal.  Nose: Nose normal.  Mouth/Throat: Oropharynx is clear and moist.  Eyes: Conjunctivae and EOM are normal. Pupils are equal, round, and reactive to light.  Neck: Normal range of motion. Neck supple. No thyromegaly present.  Cardiovascular: Normal rate, regular rhythm, normal heart sounds and intact distal pulses.  Pulmonary/Chest: Effort normal and breath sounds normal.  Abdominal: Soft. Bowel sounds are normal.  Musculoskeletal: Normal range of motion.  Lymphadenopathy:    She has no cervical adenopathy.  Neurological: She is alert and oriented to person, place, and time.  Skin: Skin is warm and dry. Capillary refill takes less than 2 seconds.  Psychiatric: She has a normal mood and affect. Her behavior is normal.  Nursing note and vitals reviewed.   Assessment and Plan:   1. Galactorrhea Well controlled.  No signs of complications, medication side effects, or red flags.  Continue current regimen.    - bromocriptine (PARLODEL) 2.5 MG tablet; Take 0.5 tablets (1.25 mg total) by mouth at bedtime.  Dispense: 15 tablet; Refill: 11  2. Attention deficit hyperactivity disorder (ADHD), unspecified ADHD type Well controlled.  No signs of complications, medication side effects, or red flags.  Continue  current regimen.    - amphetamine-dextroamphetamine (ADDERALL) 10 MG tablet; Take 1 tablet (10 mg total) by mouth 2 (two) times daily.  Dispense: 180 tablet; Refill: 0  3. Moderate episode of recurrent major depressive disorder (HCC) Well controlled.  No signs of complications, medication side effects, or red flags.  Continue current regimen.    4. Smoker The patient was counseled on the dangers of tobacco use, and was advised to quit.  Reviewed strategies to maximize success, including removing cigarettes and smoking materials from environment, stress management, support of family/friends, written materials, local smoking cessation programs (1-800-QUIT-NOW and SMOKEFREE.GOV).  . Reviewed expectations re: course of current medical issues. . Discussed self-management of symptoms. . Outlined signs and symptoms indicating need for more acute intervention. . Patient verbalized understanding and all questions were answered. [name] Health Maintenance issues including appropriate healthy diet, exercise, and smoking avoidance were discussed with patient. . See orders for this visit as documented in the electronic medical record. [name]  Patient received an After Visit Summary.  CMA served as Education administrator during this visit. History, Physical, and Plan performed by medical provider. The above documentation has been reviewed and is accurate and complete. Briscoe Deutscher, D.O.  Briscoe Deutscher, DO [place], [facility] 10/20/2017

## 2017-12-11 ENCOUNTER — Other Ambulatory Visit: Payer: Self-pay | Admitting: Family Medicine

## 2017-12-11 DIAGNOSIS — F331 Major depressive disorder, recurrent, moderate: Secondary | ICD-10-CM

## 2018-01-23 NOTE — Progress Notes (Signed)
Veronica Gray is a 22 y.o. female is here for follow up.  History of Present Illness:   HPI:   1. Attention deficit hyperactivity disorder (ADHD), unspecified ADHD type. Since the last visit has the patient had any:  Appetite changes? No Unintentional weight loss? No Is medication working well ? Yes but felt that the "orange pills" worked better Does patient take drug holidays? No Difficulties falling to sleep or maintaining sleep? No Any anxiety?  No Any cardiac issues (fainting or paliptations)? No Suicidal thoughts? No Changes in health since last visit? No New medications? No Any illicit substance abuse? No Has the patient taken his medication today? Yes    2. Cigarette nicotine dependence without complication. Down to 4 cigarettes daily.   4. Moderate episode of recurrent major depressive disorder ([place]). Stable. Still on high dose of Wellbutrin. Still has plans to call Psychiatry.   There are no preventive care reminders to display for this patient. Depression screen [facility] 2/9 01/24/2018 10/20/2017 02/25/2017  Decreased Interest 0 0 0  Down, Depressed, Hopeless 0 1 1  PHQ - 2 Score 0 1 1  Altered sleeping 1 1 0  Tired, decreased energy 1 2 0  Change in appetite 0 1 0  Feeling bad or failure about yourself  1 0 0  Trouble concentrating 0 0 0  Moving slowly or fidgety/restless 0 0 0  Suicidal thoughts 0 0 0  PHQ-9 Score 3 5 1   Difficult doing work/chores Somewhat difficult Somewhat difficult -   PMHx, SurgHx, SocialHx, FamHx, Medications, and Allergies were reviewed in the Visit Navigator and updated as appropriate.   Patient Active Problem List   Diagnosis Date Noted  . Cigarette nicotine dependence without complication 83/41/9622  . Galactorrhea 05/20/2017  . Pituitary adenoma ([place]) 05/20/2017  . ADD (attention deficit disorder) 05/20/2017  . Headache 02/25/2017  . Nipple discharge 02/25/2017  . Weight gain 02/25/2017  . Depression 02/25/2017  . Anxiety  02/25/2017  . 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 Smoking status: Current Every Day Smoker    Packs/day: 0.50    Types: Cigarettes  . Smokeless tobacco: Never Used  Substance Use Topics  . Alcohol use: Yes    Comment: occasionally  . Drug use: No   Current Medications and Allergies:   Current Outpatient Medications:  .  amphetamine-dextroamphetamine (ADDERALL) 10 MG tablet, Take 1 tablet (10 mg total) by mouth 2 (two) times daily., Disp: 180 tablet, Rfl: 0 .  bromocriptine (PARLODEL) 2.5 MG tablet, Take 0.5 tablets (1.25 mg total) by mouth at bedtime., Disp: 15 tablet, Rfl: 11 .  venlafaxine XR (EFFEXOR-XR) 150 MG 24 hr capsule, TAKE 2 CAPSULES(300 MG) BY MOUTH DAILY WITH BREAKFAST, Disp: 90 capsule, Rfl: 0 .  levonorgestrel (MIRENA) 20 MCG/24HR IUD, 1 each by Intrauterine route once., Disp: , Rfl:   No Known Allergies Review of Systems   Pertinent items are noted in the HPI. Otherwise, ROS is negative.  Vitals:   Vitals:   01/24/18 0756  BP: 118/68  Pulse: (!) 107  Temp: 98.8 F (37.1 C)  TempSrc: Oral  SpO2: 97%  Weight: 277 lb 9.6 oz (125.9 kg)  Height: 5\' 5"  (1.651 m)     Body mass index is 46.2 kg/m.  Physical Exam:   Physical Exam  Constitutional: She is oriented to person, place, and time. She appears well-developed and well-nourished. No distress.  HENT:  Head: Normocephalic and atraumatic.  Right Ear: External ear  normal.  Left Ear: External ear normal.  Nose: Nose normal.  Mouth/Throat: Oropharynx is clear and moist.  Eyes: Pupils are equal, round, and reactive to light. Conjunctivae and EOM are normal.  Neck: Normal range of motion. Neck supple. No thyromegaly present.  Cardiovascular: Normal rate, regular rhythm, normal heart sounds and intact distal pulses.  Pulmonary/Chest: Effort normal and breath sounds normal.  Abdominal: Soft. Bowel sounds are normal.  Musculoskeletal: Normal range of motion.  Lymphadenopathy:     She has no cervical adenopathy.  Neurological: She is alert and oriented to person, place, and time.  Skin: Skin is warm and dry. Capillary refill takes less than 2 seconds.  Psychiatric: She has a normal mood and affect. Her behavior is normal.  Nursing note and vitals reviewed.   Assessment and Plan:   Veronica Gray was seen today for follow-up.  Diagnoses and all orders for this visit:  Attention deficit hyperactivity disorder (ADHD), unspecified ADHD type Comments: Okay to increase to 20 mg BID. Discussed risk v benefits of increased dose. Orders: -     amphetamine-dextroamphetamine (ADDERALL) 20 MG tablet; Take 1 tablet (20 mg total) by mouth 2 (two) times daily. -     amphetamine-dextroamphetamine (ADDERALL) 20 MG tablet; Take 1 tablet (20 mg total) by mouth 2 (two) times daily. -     amphetamine-dextroamphetamine (ADDERALL) 20 MG tablet; Take 1 tablet (20 mg total) by mouth 2 (two) times daily.  Cigarette nicotine dependence without complication Comments: Precontemplative. Reviewed quitline number and resources.   Moderate episode of recurrent major depressive disorder (HCC) Comments: Will continue Effexor at currect dose until she contacts Psychiatry.  Orders: -     venlafaxine XR (EFFEXOR-XR) 150 MG 24 hr capsule; TAKE 2 CAPSULES(300 MG) BY MOUTH DAILY WITH BREAKFAST -     venlafaxine XR (EFFEXOR-XR) 75 MG 24 hr capsule; TAKE 1 CAPSULE(75 MG) BY MOUTH DAILY WITH BREAKFAST    . Reviewed expectations re: course of current medical issues. . Discussed self-management of symptoms. . Outlined signs and symptoms indicating need for more acute intervention. . Patient verbalized understanding and all questions were answered. [name] Health Maintenance issues including appropriate healthy diet, exercise, and smoking avoidance were discussed with patient. . See orders for this visit as documented in the electronic medical record. . Patient received an After Visit Summary.  Briscoe Deutscher,  DO [place], [facility] 01/24/2018  Future Appointments  Date Time Provider [place]  04/26/2018  8:20 AM Briscoe Deutscher, DO [facility]  06/29/2018  8:45 AM Renato Shin, MD [facility] None

## 2018-01-24 ENCOUNTER — Ambulatory Visit: Payer: 59 | Admitting: Family Medicine

## 2018-01-24 ENCOUNTER — Encounter: Payer: Self-pay | Admitting: Family Medicine

## 2018-01-24 VITALS — BP 118/68 | HR 107 | Temp 98.8°F | Ht 65.0 in | Wt 277.6 lb

## 2018-01-24 DIAGNOSIS — F331 Major depressive disorder, recurrent, moderate: Secondary | ICD-10-CM | POA: Diagnosis not present

## 2018-01-24 DIAGNOSIS — F17210 Nicotine dependence, cigarettes, uncomplicated: Secondary | ICD-10-CM | POA: Diagnosis not present

## 2018-01-24 DIAGNOSIS — F909 Attention-deficit hyperactivity disorder, unspecified type: Secondary | ICD-10-CM

## 2018-01-24 DIAGNOSIS — Z87891 Personal history of nicotine dependence: Secondary | ICD-10-CM | POA: Insufficient documentation

## 2018-01-24 MED ORDER — AMPHETAMINE-DEXTROAMPHETAMINE 20 MG PO TABS: 20.0000 mg | ORAL_TABLET | Freq: Two times a day (BID) | ORAL | 0 refills | Status: DC

## 2018-01-24 MED ORDER — VENLAFAXINE HCL ER 75 MG PO CP24: ORAL_CAPSULE | ORAL | 0 refills | Status: DC

## 2018-01-24 MED ORDER — VENLAFAXINE HCL ER 150 MG PO CP24: ORAL_CAPSULE | ORAL | 0 refills | Status: DC

## 2018-03-18 ENCOUNTER — Other Ambulatory Visit: Payer: Self-pay | Admitting: Family Medicine

## 2018-03-18 DIAGNOSIS — F331 Major depressive disorder, recurrent, moderate: Secondary | ICD-10-CM

## 2018-04-17 ENCOUNTER — Other Ambulatory Visit: Payer: Self-pay | Admitting: Family Medicine

## 2018-04-17 DIAGNOSIS — F331 Major depressive disorder, recurrent, moderate: Secondary | ICD-10-CM

## 2018-04-26 ENCOUNTER — Ambulatory Visit: Payer: 59 | Admitting: Family Medicine

## 2018-05-09 ENCOUNTER — Encounter: Payer: Self-pay | Admitting: Family Medicine

## 2018-05-09 ENCOUNTER — Ambulatory Visit: Payer: 59 | Admitting: Family Medicine

## 2018-05-09 VITALS — BP 126/84 | HR 100 | Temp 98.3°F | Ht 65.0 in | Wt 283.0 lb

## 2018-05-09 DIAGNOSIS — F909 Attention-deficit hyperactivity disorder, unspecified type: Secondary | ICD-10-CM

## 2018-05-09 DIAGNOSIS — F17210 Nicotine dependence, cigarettes, uncomplicated: Secondary | ICD-10-CM | POA: Diagnosis not present

## 2018-05-09 DIAGNOSIS — D352 Benign neoplasm of pituitary gland: Secondary | ICD-10-CM

## 2018-05-09 DIAGNOSIS — F331 Major depressive disorder, recurrent, moderate: Secondary | ICD-10-CM

## 2018-05-09 DIAGNOSIS — Z30431 Encounter for routine checking of intrauterine contraceptive device: Secondary | ICD-10-CM

## 2018-05-09 DIAGNOSIS — L700 Acne vulgaris: Secondary | ICD-10-CM

## 2018-05-09 MED ORDER — VENLAFAXINE HCL ER 75 MG PO CP24: ORAL_CAPSULE | ORAL | 1 refills | Status: DC

## 2018-05-09 MED ORDER — AMPHETAMINE-DEXTROAMPHETAMINE 20 MG PO TABS: 20.0000 mg | ORAL_TABLET | Freq: Two times a day (BID) | ORAL | 0 refills | Status: DC

## 2018-05-09 MED ORDER — VENLAFAXINE HCL ER 150 MG PO CP24: 150.0000 mg | ORAL_CAPSULE | Freq: Every day | ORAL | 0 refills | Status: DC

## 2018-05-09 NOTE — Progress Notes (Signed)
Veronica Gray is a 22 y.o. female is here for follow up.  History of Present Illness:   Veronica Gray CMA acting as scribe for Dr. Juleen Gray.  HPI: Patient comes in today for her three month follow up on her medication. Patient has been tolerating medication. She has been taking two of the Wellbutrin XR 150 mg. Patient states that she does not liek the dose of this. She would like to go back to the Wellbutrin 150 mg + 75 mg.   1. Attention deficit hyperactivity disorder (ADHD).   Since the last visit has the patient had any:  Appetite changes? No Unintentional weight loss? No Is medication working well ? Yes Does patient take drug holidays? No Difficulties falling to sleep or maintaining sleep? No Any anxiety?  No Any cardiac issues (fainting or paliptations)? No Suicidal thoughts? No Changes in health since last visit? No New medications? No Any illicit substance abuse? No Has the patient taken his medication today? Yes   2. Cigarette nicotine dependence without complication. Still smoking.    3. Moderate episode of recurrent major depressive disorder (Veronica Gray).  Doing well. Went up to 300 mg and noted much more anxiety/irritability.    Health Maintenance Due  Topic Date Due  . INFLUENZA VACCINE  03/23/2018   Depression screen Veronica Gray 2/9 01/24/2018 10/20/2017 02/25/2017  Decreased Interest 0 0 0  Down, Depressed, Hopeless 0 1 1  PHQ - 2 Score 0 1 1  Altered sleeping 1 1 0  Tired, decreased energy 1 2 0  Change in appetite 0 1 0  Feeling bad or failure about yourself  1 0 0  Trouble concentrating 0 0 0  Moving slowly or fidgety/restless 0 0 0  Suicidal thoughts 0 0 0  PHQ-9 Score 3 5 1   Difficult doing work/chores Somewhat difficult Somewhat difficult -   PMHx, SurgHx, SocialHx, FamHx, Medications, and Allergies were reviewed in the Visit Navigator and updated as appropriate.   Patient Active Problem List   Diagnosis Date Noted  . Cigarette nicotine dependence without  complication 95/28/4132  . Galactorrhea 05/20/2017  . Pituitary adenoma (Veronica Gray) 05/20/2017  . ADD (attention deficit disorder) 05/20/2017  . Headache 02/25/2017  . Nipple discharge 02/25/2017  . Weight gain 02/25/2017  . Depression 02/25/2017  . Anxiety 02/25/2017  . 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 Smoking status: Current Every Day Smoker    Packs/day: 0.50    Types: Cigarettes  . Smokeless tobacco: Never Used  Substance Use Topics  . Alcohol use: Yes    Comment: occasionally  . Drug use: No   Current Medications and Allergies:   .  amphetamine-dextroamphetamine (ADDERALL) 20 MG tablet, Take 1 tablet (20 mg total) by mouth 2 (two) times daily., Disp: 60 tablet, Rfl: 0 .  bromocriptine (PARLODEL) 2.5 MG tablet, Take 0.5 tablets (1.25 mg total) by mouth at bedtime., Disp: 15 tablet, Rfl: 11 .  levonorgestrel (MIRENA) 20 MCG/24HR IUD, 1 each by Intrauterine route once., Disp: , Rfl:  .  venlafaxine XR (EFFEXOR-XR) 150 (+75) MG 24 hr capsule, TAKE 2 CAPSULES(300 MG) BY MOUTH DAILY WITH BREAKFAST, Disp: 90 capsule, Rfl: 0  No Known Allergies   Review of Systems   Pertinent items are noted in the HPI. Otherwise, ROS is negative.  Vitals:   Vitals:   05/09/18 0938  BP: 126/84  Pulse: 100  Temp: 98.3 F (36.8 C)  TempSrc: Oral  SpO2: 98%  Weight: 283 lb (128.4  kg)  Height: 5\' 5"  (1.651 m)     Body mass index is 47.09 kg/m.  Physical Exam:   Physical Exam  Constitutional: She appears well-nourished.  HENT:  Head: Normocephalic and atraumatic.  Eyes: Pupils are equal, round, and reactive to light. EOM are normal.  Neck: Normal range of motion. Neck supple.  Cardiovascular: Normal rate, regular rhythm, normal heart sounds and intact distal pulses.  Pulmonary/Chest: Effort normal.  Abdominal: Soft.  Skin: Skin is warm.  Psychiatric: She has a normal mood and affect. Her behavior is normal.  Nursing note and vitals reviewed.  Assessment  and Plan:   Veronica Gray was seen today for medication refill.  Diagnoses and all orders for this visit:  Attention deficit hyperactivity disorder (ADHD), unspecified ADHD type Comments: Doing well on current dose. Orders: -     amphetamine-dextroamphetamine (ADDERALL) 20 MG tablet; Take 1 tablet (20 mg total) by mouth 2 (two) times daily. -     amphetamine-dextroamphetamine (ADDERALL) 20 MG tablet; Take 1 tablet (20 mg total) by mouth 2 (two) times daily. -     amphetamine-dextroamphetamine (ADDERALL) 20 MG tablet; Take 1 tablet (20 mg total) by mouth 2 (two) times daily.  Cigarette nicotine dependence without complication Comments: Precontemplative. Reviewed quitline number and resources.   Moderate episode of recurrent major depressive disorder (Veronica Gray) Comments: Continue Wellbutrin at 150 + 75 mg daily.  Orders: -     venlafaxine XR (EFFEXOR-XR) 75 MG 24 hr capsule; TAKE 1 CAPSULE(75 MG) BY MOUTH DAILY WITH BREAKFAST -     venlafaxine XR (EFFEXOR-XR) 150 MG 24 hr capsule; Take 1 capsule (150 mg total) by mouth daily with breakfast. To be taken with the Effexor 75 mg.  IUD check up Comments: "Short strings" per patient. States that the insertion was a bad experience. Had heavy menses recently. States that an [place] was needed to verify placement.  Orders: -     Ambulatory referral to Obstetrics / Gynecology  Acne vulgaris Comments: Worsening. Discussed checking IUD with GYN. If all okay, will consider spironolactone vs doxycyline.  Pituitary adenoma (Veronica Gray) Comments: Due for follow up with Endocrine. Has appointment.     . Reviewed expectations re: course of current medical issues. . Discussed self-management of symptoms. . Outlined signs and symptoms indicating need for more acute intervention. . Patient verbalized understanding and all questions were answered. [name] Health Maintenance issues including appropriate healthy diet, exercise, and smoking avoidance were discussed with  patient. . See orders for this visit as documented in the electronic medical record. . Patient received an After Visit Summary.  CMA served as Education administrator during this visit. History, Physical, and Plan performed by medical provider. The above documentation has been reviewed and is accurate and complete. Briscoe Deutscher, D.O.  Briscoe Deutscher, DO [place], [facility] 05/09/2018

## 2018-05-17 ENCOUNTER — Other Ambulatory Visit: Payer: Self-pay | Admitting: Family Medicine

## 2018-05-17 DIAGNOSIS — F331 Major depressive disorder, recurrent, moderate: Secondary | ICD-10-CM

## 2018-05-30 ENCOUNTER — Other Ambulatory Visit: Payer: Self-pay

## 2018-05-30 ENCOUNTER — Ambulatory Visit: Payer: 59 | Admitting: Obstetrics and Gynecology

## 2018-05-30 ENCOUNTER — Other Ambulatory Visit (HOSPITAL_COMMUNITY)
Admission: RE | Admit: 2018-05-30 | Discharge: 2018-05-30 | Disposition: A | Discharge status: Home / Self Care | Payer: 59 | Source: Ambulatory Visit | Attending: Obstetrics and Gynecology | Admitting: Obstetrics and Gynecology

## 2018-05-30 ENCOUNTER — Encounter: Payer: Self-pay | Admitting: Obstetrics and Gynecology

## 2018-05-30 VITALS — BP 126/84 | HR 96 | Ht 65.0 in | Wt 283.0 lb

## 2018-05-30 DIAGNOSIS — Z124 Encounter for screening for malignant neoplasm of cervix: Secondary | ICD-10-CM | POA: Insufficient documentation

## 2018-05-30 DIAGNOSIS — Z30431 Encounter for routine checking of intrauterine contraceptive device: Secondary | ICD-10-CM

## 2018-05-30 DIAGNOSIS — D352 Benign neoplasm of pituitary gland: Secondary | ICD-10-CM | POA: Diagnosis not present

## 2018-05-30 DIAGNOSIS — Z01419 Encounter for gynecological examination (general) (routine) without abnormal findings: Secondary | ICD-10-CM | POA: Diagnosis not present

## 2018-05-30 DIAGNOSIS — R197 Diarrhea, unspecified: Secondary | ICD-10-CM

## 2018-05-30 DIAGNOSIS — F17200 Nicotine dependence, unspecified, uncomplicated: Secondary | ICD-10-CM

## 2018-05-30 NOTE — Patient Instructions (Addendum)
EXERCISE AND DIET:  We recommended that you start or continue a regular exercise program for good health. Regular exercise means any activity that makes your heart beat faster and makes you sweat.  We recommend exercising at least 30 minutes per day at least 3 days a week, preferably 4 or 5.  We also recommend a diet low in fat and sugar.  Inactivity, poor dietary choices and obesity can cause diabetes, heart attack, stroke, and kidney damage, among others.    ALCOHOL AND SMOKING:  Women should limit their alcohol intake to no more than 7 drinks/beers/glasses of wine (combined, not each!) per week. Moderation of alcohol intake to this level decreases your risk of breast cancer and liver damage. And of course, no recreational drugs are part of a healthy lifestyle.  And absolutely no smoking or even second hand smoke. Most people know smoking can cause heart and lung diseases, but did you know it also contributes to weakening of your bones? Aging of your skin?  Yellowing of your teeth and nails?  CALCIUM AND VITAMIN D:  Adequate intake of calcium and Vitamin D are recommended.  The recommendations for exact amounts of these supplements seem to change often, but generally speaking 600 mg of calcium (either carbonate or citrate) and 800 units of Vitamin D per day seems prudent. Certain women may benefit from higher intake of Vitamin D.  If you are among these women, your doctor will have told you during your visit.    PAP SMEARS:  Pap smears, to check for cervical cancer or precancers,  have traditionally been done yearly, although recent scientific advances have shown that most women can have pap smears less often.  However, every woman still should have a physical exam from her gynecologist every year. It will include a breast check, inspection of the vulva and vagina to check for abnormal growths or skin changes, a visual exam of the cervix, and then an exam to evaluate the size and shape of the uterus and  ovaries.  And after 24 years of age, a rectal exam is indicated to check for rectal cancers. We will also provide age appropriate advice regarding health maintenance, like when you should have certain vaccines, screening for sexually transmitted diseases, bone density testing, colonoscopy, mammograms, etc.   MAMMOGRAMS:  All women over 40 years old should have a yearly mammogram. Many facilities now offer a "3D" mammogram, which may cost around $50 extra out of pocket. If possible,  we recommend you accept the option to have the 3D mammogram performed.  It both reduces the number of women who will be called back for extra views which then turn out to be normal, and it is better than the routine mammogram at detecting truly abnormal areas.    COLONOSCOPY:  Colonoscopy to screen for colon cancer is recommended for all women at age 50.  We know, you hate the idea of the prep.  We agree, BUT, having colon cancer and not knowing it is worse!!  Colon cancer so often starts as a polyp that can be seen and removed at colonscopy, which can quite literally save your life!  And if your first colonoscopy is normal and you have no family history of colon cancer, most women don't have to have it again for 10 years.  Once every ten years, you can do something that may end up saving your life, right?  We will be happy to help you get it scheduled when you are ready.    Be sure to check your insurance coverage so you understand how much it will cost.  It may be covered as a preventative service at no cost, but you should check your particular policy.      Steps to Quit Smoking Smoking tobacco can be harmful to your health and can affect almost every organ in your body. Smoking puts you, and those around you, at risk for developing many serious chronic diseases. Quitting smoking is difficult, but it is one of the best things that you can do for your health. It is never too late to quit. What are the benefits of quitting  smoking? When you quit smoking, you lower your risk of developing serious diseases and conditions, such as:  Lung cancer or lung disease, such as COPD.  Heart disease.  Stroke.  Heart attack.  Infertility.  Osteoporosis and bone fractures.  Additionally, symptoms such as coughing, wheezing, and shortness of breath may get better when you quit. You may also find that you get sick less often because your body is stronger at fighting off colds and infections. If you are pregnant, quitting smoking can help to reduce your chances of having a baby of low birth weight. How do I get ready to quit? When you decide to quit smoking, create a plan to make sure that you are successful. Before you quit:  Pick a date to quit. Set a date within the next two weeks to give you time to prepare.  Write down the reasons why you are quitting. Keep this list in places where you will see it often, such as on your bathroom mirror or in your car or wallet.  Identify the people, places, things, and activities that make you want to smoke (triggers) and avoid them. Make sure to take these actions: ? Throw away all cigarettes at home, at work, and in your car. ? Throw away smoking accessories, such as Scientist, research (medical). ? Clean your car and make sure to empty the ashtray. ? Clean your home, including curtains and carpets.  Tell your family, friends, and coworkers that you are quitting. Support from your loved ones can make quitting easier.  Talk with your health care provider about your options for quitting smoking.  Find out what treatment options are covered by your health insurance.  What strategies can I use to quit smoking? Talk with your healthcare provider about different strategies to quit smoking. Some strategies include:  Quitting smoking altogether instead of gradually lessening how much you smoke over a period of time. Research shows that quitting "cold [place]" is more successful than  gradually quitting.  Attending in-person counseling to help you build problem-solving skills. You are more likely to have success in quitting if you attend several counseling sessions. Even short sessions of 10 minutes can be effective.  Finding resources and support systems that can help you to quit smoking and remain smoke-free after you quit. These resources are most helpful when you use them often. They can include: ? Online chats with a Social worker. ? Telephone quitlines. ? Careers information officer. ? Support groups or group counseling. ? Text messaging programs. ? Mobile phone applications.  Taking medicines to help you quit smoking. (If you are pregnant or breastfeeding, talk with your health care provider first.) Some medicines contain nicotine and some do not. Both types of medicines help with cravings, but the medicines that include nicotine help to relieve withdrawal symptoms. Your health care provider may recommend: ? Nicotine patches, gum, or  lozenges. ? Nicotine inhalers or sprays. ? Non-nicotine medicine that is taken by mouth.  Talk with your health care provider about combining strategies, such as taking medicines while you are also receiving in-person counseling. Using these two strategies together makes you more likely to succeed in quitting than if you used either strategy on its own. If you are pregnant or breastfeeding, talk with your health care provider about finding counseling or other support strategies to quit smoking. Do not take medicine to help you quit smoking unless told to do so by your health care provider. What things can I do to make it easier to quit? Quitting smoking might feel overwhelming at first, but there is a lot that you can do to make it easier. Take these important actions:  Reach out to your family and friends and ask that they support and encourage you during this time. Call telephone quitlines, reach out to support groups, or work with a  counselor for support.  Ask people who smoke to avoid smoking around you.  Avoid places that trigger you to smoke, such as bars, parties, or smoke-break areas at work.  Spend time around people who do not smoke.  Lessen stress in your life, because stress can be a smoking trigger for some people. To lessen stress, try: ? Exercising regularly. ? Deep-breathing exercises. ? Yoga. ? Meditating. ? Performing a body scan. This involves closing your eyes, scanning your body from head to toe, and noticing which parts of your body are particularly tense. Purposefully relax the muscles in those areas.  Download or purchase mobile phone or tablet apps (applications) that can help you stick to your quit plan by providing reminders, tips, and encouragement. There are many free apps, such as QuitGuide from the [employer] Office manager for Disease Control and Prevention). You can find other support for quitting smoking (smoking cessation) through smokefree.gov and other websites.  How will I feel when I quit smoking? Within the first 24 hours of quitting smoking, you may start to feel some withdrawal symptoms. These symptoms are usually most noticeable 2-3 days after quitting, but they usually do not last beyond 2-3 weeks. Changes or symptoms that you might experience include:  Mood swings.  Restlessness, anxiety, or irritation.  Difficulty concentrating.  Dizziness.  Strong cravings for sugary foods in addition to nicotine.  Mild weight gain.  Constipation.  Nausea.  Coughing or a sore throat.  Changes in how your medicines work in your body.  A depressed mood.  Difficulty sleeping (insomnia).  After the first 2-3 weeks of quitting, you may start to notice more positive results, such as:  Improved sense of smell and taste.  Decreased coughing and sore throat.  Slower heart rate.  Lower blood pressure.  Clearer skin.  The ability to breathe more easily.  Fewer sick days.  Quitting  smoking is very challenging for most people. Do not get discouraged if you are not successful the first time. Some people need to make many attempts to quit before they achieve long-term success. Do your best to stick to your quit plan, and talk with your health care provider if you have any questions or concerns. This information is not intended to replace advice given to you by your health care provider. Make sure you discuss any questions you have with your health care provider. Document Released: 08/03/2001 Document Revised: 04/06/2016 Document Reviewed: 12/24/2014 Elsevier Interactive Patient Education  2018 [place] Breast self-awareness means being familiar with how  your breasts look and feel. It involves checking your breasts regularly and reporting any changes to your health care provider. Practicing breast self-awareness is important. A change in your breasts can be a sign of a serious medical problem. Being familiar with how your breasts look and feel allows you to find any problems early, when treatment is more likely to be successful. All women should practice breast self-awareness, including women who have had breast implants. How to do a breast self-exam One way to learn what is normal for your breasts and whether your breasts are changing is to do a breast self-exam. To do a breast self-exam: Look for Changes  1. Remove all the clothing above your waist. 2. Stand in front of a mirror in a room with good lighting. 3. Put your hands on your hips. 4. Push your hands firmly downward. 5. Compare your breasts in the mirror. Look for differences between them (asymmetry), such as: ? Differences in shape. ? Differences in size. ? Puckers, dips, and bumps in one breast and not the other. 6. Look at each breast for changes in your skin, such as: ? Redness. ? Scaly areas. 7. Look for changes in your nipples, such  as: ? Discharge. ? Bleeding. ? Dimpling. ? Redness. ? A change in position. Feel for Changes  Carefully feel your breasts for lumps and changes. It is best to do this while lying on your back on the floor and again while sitting or standing in the shower or tub with soapy water on your skin. Feel each breast in the following way:  Place the arm on the side of the breast you are examining above your head.  Feel your breast with the other hand.  Start in the nipple area and make  inch (2 cm) overlapping circles to feel your breast. Use the pads of your three middle fingers to do this. Apply light pressure, then medium pressure, then firm pressure. The light pressure will allow you to feel the tissue closest to the skin. The medium pressure will allow you to feel the tissue that is a little deeper. The firm pressure will allow you to feel the tissue close to the ribs.  Continue the overlapping circles, moving downward over the breast until you feel your ribs below your breast.  Move one finger-width toward the center of the body. Continue to use the  inch (2 cm) overlapping circles to feel your breast as you move slowly up toward your collarbone.  Continue the up and down exam using all three pressures until you reach your armpit.  Write Down What You Find  Write down what is normal for each breast and any changes that you find. Keep a written record with breast changes or normal findings for each breast. By writing this information down, you do not need to depend only on memory for size, tenderness, or location. Write down where you are in your menstrual cycle, if you are still menstruating. If you are having trouble noticing differences in your breasts, do not get discouraged. With time you will become more familiar with the variations in your breasts and more comfortable with the exam. How often should I examine my breasts? Examine your breasts every month. If you are breastfeeding, the  best time to examine your breasts is after a feeding or after using a breast pump. If you menstruate, the best time to examine your breasts is 5-7 days after your period is over. During your period, your breasts  are lumpier, and it may be more difficult to notice changes. When should I see my health care provider? See your health care provider if you notice:  A change in shape or size of your breasts or nipples.  A change in the skin of your breast or nipples, such as a reddened or scaly area.  Unusual discharge from your nipples.  A lump or thick area that was not there before.  Pain in your breasts.  Anything that concerns you.  This information is not intended to replace advice given to you by your health care provider. Make sure you discuss any questions you have with your health care provider. Document Released: 08/09/2005 Document Revised: 01/15/2016 Document Reviewed: 06/29/2015 Elsevier Interactive Patient Education  [employer].

## 2018-05-30 NOTE — Progress Notes (Signed)
22 y.o. G0P0000 Married White or Caucasian Not Hispanic or Latino female here for annual exam.  She has a h/o a pituitary microadenoma, she presented with galactorrhea but a normal prolactin level. On Parlodel, followed by Dr Loanne Drilling.   She has a mirena IUD, placed in 2016 (?summer). Getting a cycle about every 3 months. Period Duration (Days): 7 days Period Pattern: (!) Irregular Menstrual Flow: Moderate, Light Menstrual Control: Tampon, Thin pad Menstrual Control Change Freq (Hours): changes pad/tampon every 8 hours Dysmenorrhea: (!) Mild Dysmenorrhea Symptoms: Cramping  No dyspareunia, no plans for kids in the next year or so.   No LMP recorded. (Menstrual status: IUD).          Sexually active: Yes.    The current method of family planning is IUD.    Exercising: No.  The patient does not participate in regular exercise at present. Smoker:  Yes, 4 cigarettes a day, trying to quit.   Health Maintenance: Pap:  2016 normal per patient History of abnormal Pap:  no TDaP:  Up to date Gardasil: completed all 3   reports that she has been smoking cigarettes. She has been smoking about 0.50 packs per day. She has never used smokeless tobacco. She reports that she drinks alcohol. She reports that she does not use drugs. Just occasional ETOH. She does office work.   Past Medical History:  Diagnosis Date  . ADHD   . Anemia   . Anxiety   . Current smoker   . Depression   . Hypertension   . Pituitary microadenoma [facility])   She is doing well from a depression standpoint, wants to get off of Effexor.  Not on any meds for HTN  History reviewed. No pertinent surgical history.  Current Outpatient Medications  Medication Sig Dispense Refill  . amphetamine-dextroamphetamine (ADDERALL) 20 MG tablet Take 1 tablet (20 mg total) by mouth 2 (two) times daily. 60 tablet 0  . [START ON 06/08/2018] amphetamine-dextroamphetamine (ADDERALL) 20 MG tablet Take 1 tablet (20 mg total) by mouth 2 (two) times  daily. 60 tablet 0  . [START ON 07/09/2018] amphetamine-dextroamphetamine (ADDERALL) 20 MG tablet Take 1 tablet (20 mg total) by mouth 2 (two) times daily. 60 tablet 0  . bromocriptine (PARLODEL) 2.5 MG tablet Take 0.5 tablets (1.25 mg total) by mouth at bedtime. 15 tablet 11  . levonorgestrel (MIRENA) 20 MCG/24HR IUD 1 each by Intrauterine route once.    . venlafaxine XR (EFFEXOR-XR) 150 MG 24 hr capsule Take 1 capsule (150 mg total) by mouth daily with breakfast. To be taken with the Effexor 75 mg. 90 capsule 0  . venlafaxine XR (EFFEXOR-XR) 75 MG 24 hr capsule TAKE 1 CAPSULE(75 MG) BY MOUTH DAILY WITH BREAKFAST 90 capsule 1   No current facility-administered medications for this visit.     Family History  Problem Relation Age of Onset  . Hyperlipidemia Father   . Hypertension Father   . Mental illness Maternal Grandmother   . Stroke Maternal Grandfather   . Hyperlipidemia Paternal Grandmother   . Cancer Paternal Grandfather   . Other Neg Hx        galactorrhea    Review of Systems  Constitutional: Negative.   HENT: Negative.   Eyes: Negative.   Respiratory: Negative.   Cardiovascular: Negative.   Gastrointestinal: Negative.   Endocrine: Negative.   Genitourinary: Positive for vaginal bleeding.  Musculoskeletal: Negative.   Skin: Negative.   Allergic/Immunologic: Negative.   Neurological: Negative.   Hematological: Negative.  Psychiatric/Behavioral: Negative.   She thinks she is lactose intolerant.  She has frequent BM's, on average 3 x a day. Sometimes up to 6 x a day. Can go from watery to loose. Dairy worsens her symptoms. Even without dairy. Bowel symptoms have been going on since 2013.  Exam:   BP 126/84 (BP Location: Right Arm, Patient Position: Sitting, Cuff Size: Large)   Pulse 96   Ht 5\' 5"  (1.651 m)   Wt 283 lb (128.4 kg)   BMI 47.09 kg/m   Weight change: @WEIGHTCHANGE @ Height:   Height: 5\' 5"  (165.1 cm)  Ht Readings from Last 3 Encounters:  05/30/18 5\' 5"   (1.651 m)  05/09/18 5\' 5"  (1.651 m)  01/24/18 5\' 5"  (1.651 m)    General appearance: alert, cooperative and appears stated age Head: Normocephalic, without obvious abnormality, atraumatic Neck: no adenopathy, supple, symmetrical, trachea midline and thyroid normal to inspection and palpation Lungs: clear to auscultation bilaterally Cardiovascular: regular rate and rhythm Breasts: normal appearance, no masses or tenderness Abdomen: soft, non-tender; non distended,  no masses,  no organomegaly Extremities: extremities normal, atraumatic, no cyanosis or edema Skin: Skin color, texture, turgor normal. No rashes or lesions. She does have moderate facial acne.  Lymph nodes: Cervical, supraclavicular, and axillary nodes normal. No abnormal inguinal nodes palpated Neurologic: Grossly normal   Pelvic: External genitalia:  no lesions              Urethra:  normal appearing urethra with no masses, tenderness or lesions              Bartholins and Skenes: normal                 Vagina: normal appearing vagina with normal color and discharge, no lesions              Cervix: no lesions and IUD string 1 cm               Bimanual Exam:  Uterus:  normal size, contour, position, consistency, mobility, non-tender and anteverted              Adnexa: no mass, fullness, tenderness               Rectovaginal: Confirms               Anus:  normal sphincter tone, no lesions  Chaperone was present for exam.  A:  Well Woman with normal exam  IUD check, doing well  H/O microadenoma, on Parlodel  Loose stools to diarrhea  Smoker, working on quitting  H/O vit d def (will get testing with Dr Juleen China)  P:   Pap with hpv  Discussed breast self exam  Discussed calcium and vit D intake  She will do her labs with Dr Juleen China  Declines STD testing  Has f/u with Dr Loanne Drilling next month  Discussed dietary changes to see if it helps with her diarrhea  Recommended she f/u with Dr Juleen China for evaluation/treatment  of her diarrhea

## 2018-06-01 LAB — CYTOLOGY - PAP: Diagnosis: NEGATIVE

## 2018-06-29 ENCOUNTER — Encounter: Payer: Self-pay | Admitting: Endocrinology

## 2018-06-29 ENCOUNTER — Ambulatory Visit: Payer: 59 | Admitting: Endocrinology

## 2018-06-29 VITALS — BP 124/82 | HR 103 | Ht 65.0 in | Wt 285.0 lb

## 2018-06-29 DIAGNOSIS — D352 Benign neoplasm of pituitary gland: Secondary | ICD-10-CM | POA: Diagnosis not present

## 2018-06-29 LAB — TSH: TSH: 1.93 u[IU]/mL (ref 0.35–4.50)

## 2018-06-29 LAB — T4, FREE: Free T4: 0.67 ng/dL (ref 0.60–1.60)

## 2018-06-29 NOTE — Patient Instructions (Addendum)
blood tests are requested for you today.  We'll let you know about the results. Please continue the same medication.  Please come back for a follow-up appointment in 1 year.

## 2018-06-29 NOTE — Progress Notes (Signed)
Subjective:    Patient ID: Veronica Gray, female    DOB: 1996/05/04, 22 y.o.   MRN: [id number]  HPI Pt returns for f/u of galactorrhea (dx'ed 2018; no cause was found; pituitary MRI showed adenoma, 6 x 5 mm; she has always had irregular and heavy menses; she took oral contraceptives from age 67 until 2017, when she got IUD; she is G0; she does not want a pregnancy now, but would like to preserve fertility for the future; parlodel is chosen as rx, despite normal prolactin).  Galactorrhea is resolved.  She has a mirena device.  She has menstruation every 3 mos.   Past Medical History:  Diagnosis Date  . ADHD   . Anemia   . Anxiety   . Current smoker   . Depression   . Hypertension   . Pituitary microadenoma ([place])   . Vitamin D deficiency     No past surgical history on file.  Social History   Socioeconomic History  . Marital status: Married    Spouse name: Not on file  . Number of children: Not on file  . Years of education: Not on file  . Highest education level: Not on file  Occupational History  . Not on file  Social Needs  . Financial resource strain: Not on file  . Food insecurity:    Worry: Not on file    Inability: Not on file  . Transportation needs:    Medical: Not on file    Non-medical: Not on file  Tobacco Use  . Smoking status: Current Every Day Smoker    Packs/day: 0.50    Types: Cigarettes  . Smokeless tobacco: Never Used  Substance and Sexual Activity  . Alcohol use: Yes    Comment: occasionally  . Drug use: No  . Sexual activity: Yes    Birth control/protection: IUD  Lifestyle  . Physical activity:    Days per week: Not on file    Minutes per session: Not on file  . Stress: Not on file  Relationships  . Social connections:    Talks on phone: Not on file    Gets together: Not on file    Attends religious service: Not on file    Active member of club or organization: Not on file    Attends meetings of clubs or organizations: Not on file   Relationship status: Not on file  . Intimate partner violence:    Fear of current or ex partner: Not on file    Emotionally abused: Not on file    Physically abused: Not on file    Forced sexual activity: Not on file  Other Topics Concern  . Not on file  Social History Narrative  . Not on file    Current Outpatient Medications on File Prior to Visit  Medication Sig Dispense Refill  . amphetamine-dextroamphetamine (ADDERALL) 20 MG tablet Take 1 tablet (20 mg total) by mouth 2 (two) times daily. 60 tablet 0  . bromocriptine (PARLODEL) 2.5 MG tablet Take 0.5 tablets (1.25 mg total) by mouth at bedtime. 15 tablet 11  . levonorgestrel (MIRENA) 20 MCG/24HR IUD 1 each by Intrauterine route once.    . venlafaxine XR (EFFEXOR-XR) 150 MG 24 hr capsule Take 1 capsule (150 mg total) by mouth daily with breakfast. To be taken with the Effexor 75 mg. 90 capsule 0  . venlafaxine XR (EFFEXOR-XR) 75 MG 24 hr capsule TAKE 1 CAPSULE(75 MG) BY MOUTH DAILY WITH BREAKFAST 90 capsule 1  No current facility-administered medications on file prior to visit.     Allergies  Allergen Reactions  . Wellbutrin [Bupropion]     Family History  Problem Relation Age of Onset  . Hyperlipidemia Father   . Hypertension Father   . Mental illness Maternal Grandmother   . Stroke Maternal Grandfather   . Hyperlipidemia Paternal Grandmother   . Cancer Paternal Grandfather   . Other Neg Hx        galactorrhea    BP 124/82 (BP Location: Right Arm, Patient Position: Sitting, Cuff Size: Large)   Pulse (!) 103 Comment: drank a cup of coffee before appt  Ht 5\' 5"  (1.651 m)   Wt 285 lb (129.3 kg)   SpO2 97%   BMI 47.43 kg/m    Review of Systems Headaches are less frequent now.      Objective:   Physical Exam VITAL SIGNS:  See vs page GENERAL: no distress NECK: There is no palpable thyroid enlargement.  No thyroid nodule is palpable.  No palpable lymphadenopathy at the anterior neck.        Assessment &  Plan:  Galactorrhea: recheck labs today Pituitary adenoma: we discussed.  She declines f/u MRI, due to cost.    Patient Instructions  blood tests are requested for you today.  We'll let you know about the results. Please continue the same medication.  Please come back for a follow-up appointment in 1 year.

## 2018-06-30 LAB — PROLACTIN: PROLACTIN: 22.5 ng/mL

## 2018-08-09 ENCOUNTER — Ambulatory Visit: Payer: 59 | Admitting: Family Medicine

## 2018-08-09 ENCOUNTER — Encounter: Payer: Self-pay | Admitting: Family Medicine

## 2018-08-09 VITALS — BP 124/80 | HR 95 | Temp 98.6°F | Ht 65.0 in | Wt 288.2 lb

## 2018-08-09 DIAGNOSIS — Z23 Encounter for immunization: Secondary | ICD-10-CM

## 2018-08-09 DIAGNOSIS — D352 Benign neoplasm of pituitary gland: Secondary | ICD-10-CM | POA: Diagnosis not present

## 2018-08-09 DIAGNOSIS — F909 Attention-deficit hyperactivity disorder, unspecified type: Secondary | ICD-10-CM | POA: Diagnosis not present

## 2018-08-09 DIAGNOSIS — F331 Major depressive disorder, recurrent, moderate: Secondary | ICD-10-CM | POA: Diagnosis not present

## 2018-08-09 DIAGNOSIS — E739 Lactose intolerance, unspecified: Secondary | ICD-10-CM

## 2018-08-09 DIAGNOSIS — F17210 Nicotine dependence, cigarettes, uncomplicated: Secondary | ICD-10-CM | POA: Diagnosis not present

## 2018-08-09 DIAGNOSIS — Z716 Tobacco abuse counseling: Secondary | ICD-10-CM

## 2018-08-09 DIAGNOSIS — R197 Diarrhea, unspecified: Secondary | ICD-10-CM | POA: Diagnosis not present

## 2018-08-09 DIAGNOSIS — E559 Vitamin D deficiency, unspecified: Secondary | ICD-10-CM | POA: Insufficient documentation

## 2018-08-09 DIAGNOSIS — Z6841 Body Mass Index (BMI) 40.0 and over, adult: Secondary | ICD-10-CM

## 2018-08-09 LAB — CBC WITH DIFFERENTIAL/PLATELET
Basophils Absolute: 0 10*3/uL (ref 0.0–0.1)
Basophils Relative: 0.4 % (ref 0.0–3.0)
Eosinophils Absolute: 0.2 10*3/uL (ref 0.0–0.7)
Eosinophils Relative: 1.7 % (ref 0.0–5.0)
HCT: 37.2 % (ref 36.0–46.0)
Hemoglobin: 12.3 g/dL (ref 12.0–15.0)
Lymphocytes Relative: 25.6 % (ref 12.0–46.0)
Lymphs Abs: 2.6 10*3/uL (ref 0.7–4.0)
MCHC: 33.1 g/dL (ref 30.0–36.0)
MCV: 85.9 fl (ref 78.0–100.0)
Monocytes Absolute: 0.4 10*3/uL (ref 0.1–1.0)
Monocytes Relative: 4.3 % (ref 3.0–12.0)
Neutro Abs: 6.8 10*3/uL (ref 1.4–7.7)
Neutrophils Relative %: 68 % (ref 43.0–77.0)
Platelets: 286 10*3/uL (ref 150.0–400.0)
RBC: 4.33 Mil/uL (ref 3.87–5.11)
RDW: 13.6 % (ref 11.5–15.5)
WBC: 10 10*3/uL (ref 4.0–10.5)

## 2018-08-09 LAB — COMPREHENSIVE METABOLIC PANEL
ALT: 19 U/L (ref 0–35)
AST: 13 U/L (ref 0–37)
Albumin: 4 g/dL (ref 3.5–5.2)
Alkaline Phosphatase: 88 U/L (ref 39–117)
BUN: 13 mg/dL (ref 6–23)
CO2: 25 mEq/L (ref 19–32)
Calcium: 9.1 mg/dL (ref 8.4–10.5)
Chloride: 104 mEq/L (ref 96–112)
Creatinine, Ser: 0.56 mg/dL (ref 0.40–1.20)
GFR: 141.03 mL/min (ref >60.00)
Glucose, Bld: 111 mg/dL — ABNORMAL HIGH (ref 70–99)
Potassium: 4.1 mEq/L (ref 3.5–5.1)
Sodium: 138 mEq/L (ref 135–145)
Total Bilirubin: 0.2 mg/dL (ref 0.2–1.2)
Total Protein: 6.9 g/dL (ref 6.0–8.3)

## 2018-08-09 LAB — LIPASE: Lipase: 21 U/L (ref 11.0–59.0)

## 2018-08-09 MED ORDER — AMPHETAMINE-DEXTROAMPHETAMINE 20 MG PO TABS: 20.0000 mg | ORAL_TABLET | Freq: Two times a day (BID) | ORAL | 0 refills | Status: DC

## 2018-08-09 MED ORDER — VENLAFAXINE HCL ER 75 MG PO CP24: ORAL_CAPSULE | ORAL | 1 refills | Status: DC

## 2018-08-09 NOTE — Progress Notes (Signed)
Veronica Gray is a 22 y.o. female is here for follow up.  History of Present Illness:   Veronica Gray, CMA acting as scribe for Dr. Briscoe Deutscher.   Diarrhea   This is a chronic problem. The current episode started more than 1 month ago. The problem occurs 5 to 10 times per day. The problem has been unchanged. The stool consistency is described as mucous. The patient states that diarrhea does not awaken her from sleep. Pertinent negatives include no arthralgias, bloating, chills, fever, myalgias, sweats, vomiting or weight loss. The symptoms are aggravated by dairy products and stress. There are no known risk factors. She has tried change of diet for the symptoms. The treatment provided no relief.   Patient in office for three month follow up on medication. She has been tolerating medications. She is taking Effexor xr bot the 150 and 75mg  . She has not set up an appointment with psychology yet.   Attention deficit hyperactivity disorder (ADHD).   Since the last visit has the patient had any: Appetite changes? No Unintentional weight loss? No Is medication working well ? Yes Does patient take drug holidays? No Difficulties falling to sleep or maintaining sleep? No Any anxiety?  No Any cardiac issues (fainting or paliptations)? No Suicidal thoughts? No Changes in health since last visit? No New medications? No Any illicit substance abuse? No Has the patient taken his medication today? No  Health Maintenance Due  Topic Date Due  . INFLUENZA VACCINE  03/23/2018   Depression screen [facility] 2/9 08/09/2018 01/24/2018 10/20/2017  Decreased Interest 0 0 0  Down, Depressed, Hopeless 0 0 1  PHQ - 2 Score 0 0 1  Altered sleeping 0 1 1  Tired, decreased energy 0 1 2  Change in appetite 0 0 1  Feeling bad or failure about yourself  0 1 0  Trouble concentrating 0 0 0  Moving slowly or fidgety/restless 0 0 0  Suicidal thoughts 0 0 0  PHQ-9 Score 0 3 5  Difficult doing work/chores -  Somewhat difficult Somewhat difficult   PMHx, SurgHx, SocialHx, FamHx, Medications, and Allergies were reviewed in the Visit Navigator and updated as appropriate.   Patient Active Problem List   Diagnosis Date Noted  . Cigarette nicotine dependence without complication 14/78/2956  . Galactorrhea 05/20/2017  . Pituitary adenoma ([place]) 05/20/2017  . ADD (attention deficit disorder) 05/20/2017  . Headache 02/25/2017  . Weight gain 02/25/2017  . Depression 02/25/2017  . Anxiety 02/25/2017  . 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 Smoking status: Current Every Day Smoker    Packs/day: 0.50    Types: Cigarettes  . Smokeless tobacco: Never Used  Substance Use Topics  . Alcohol use: Yes    Comment: occasionally  . Drug use: No   Current Medications and Allergies:   .  amphetamine-dextroamphetamine (ADDERALL) 20 MG tablet, Take 1 tablet (20 mg total) by mouth 2 (two) times daily., Disp: 60 tablet, Rfl: 0 .  bromocriptine (PARLODEL) 2.5 MG tablet, Take 0.5 tablets (1.25 mg total) by mouth at bedtime., Disp: 15 tablet, Rfl: 11 .  levonorgestrel (MIRENA) 20 MCG/24HR IUD, 1 each by Intrauterine route once., Disp: , Rfl:  .  venlafaxine XR (EFFEXOR-XR) 150 MG 24 hr capsule, Take 1 capsule (150 mg total) by mouth daily with breakfast. To be taken with the Effexor 75 mg., Disp: 90 capsule, Rfl: 0 .  venlafaxine XR (EFFEXOR-XR) 75 MG 24 hr capsule, TAKE 1  CAPSULE(75 MG) BY MOUTH DAILY WITH BREAKFAST, Disp: 90 capsule, Rfl: 1   Allergies  Allergen Reactions  . Wellbutrin [Bupropion]    Review of Systems   Pertinent items are noted in the HPI. Otherwise, a complete ROS is negative.  Vitals:   Vitals:   08/09/18 0830  BP: 124/80  Pulse: 95  Temp: 98.6 F (37 C)  TempSrc: Oral  SpO2: 97%  Weight: 288 lb 3.2 oz (130.7 kg)  Height: 5\' 5"  (1.651 m)     Body mass index is 47.96 kg/m.  Physical Exam:   Physical Exam Vitals signs and nursing note reviewed.    Constitutional:      Appearance: She is obese.  HENT:     Head: Normocephalic and atraumatic.  Eyes:     Pupils: Pupils are equal, round, and reactive to light.  Neck:     Musculoskeletal: Normal range of motion and neck supple.  Cardiovascular:     Rate and Rhythm: Normal rate and regular rhythm.     Heart sounds: Normal heart sounds.  Pulmonary:     Effort: Pulmonary effort is normal.  Abdominal:     General: Bowel sounds are normal. There is distension.     Palpations: Abdomen is soft.  Skin:    General: Skin is warm.  Neurological:     Mental Status: She is alert.  Psychiatric:        Behavior: Behavior normal.    Results for orders placed or performed in visit on 06/29/18  Prolactin  Result Value Ref Range   Prolactin 22.5 ng/mL  TSH  Result Value Ref Range   TSH 1.93 0.35 - 4.50 uIU/mL  T4, free  Result Value Ref Range   Free T4 0.67 0.60 - 1.60 ng/dL   Assessment and Plan:   Veronica Gray was seen today for follow-up.  Diagnoses and all orders for this visit:  Cigarette nicotine dependence without complication Comments: Reviewed smoking cessation.  Attention deficit hyperactivity disorder (ADHD), unspecified ADHD type Comments: Doing well on current dose. Orders: -     amphetamine-dextroamphetamine (ADDERALL) 20 MG tablet; Take 1 tablet (20 mg total) by mouth 2 (two) times daily. -     amphetamine-dextroamphetamine (ADDERALL) 20 MG tablet; Take 1 tablet (20 mg total) by mouth 2 (two) times daily. -     amphetamine-dextroamphetamine (ADDERALL) 20 MG tablet; Take 1 tablet (20 mg total) by mouth 2 (two) times daily.  Moderate episode of recurrent major depressive disorder (HCC) Comments: Continue Wellbutrin at 150 + 75 mg daily.  Orders: -     venlafaxine XR (EFFEXOR-XR) 75 MG 24 hr capsule; TAKE 1 CAPSULE(75 MG) BY MOUTH DAILY WITH BREAKFAST  Pituitary adenoma ([place]) Comments: Followed by Endocrinology. She will have better insurance next year and would  like MRI.  Vitamin D deficiency  Diarrhea, unspecified type Comments: Will check labs today. Consider RUQ [place].  Orders: -     CBC with Differential/Platelet -     Comprehensive metabolic panel -     Lipase -     Gliadin antibodies, serum -     Tissue transglutaminase, IgA -     Reticulin Antibody, IgA w reflex titer -     Ova and parasite examination; Future -     Stool culture; Future -     Clostridium Difficile by PCR; Future  Lactose intolerance Comments: She will avoid dairy.  Tobacco Cessation Counseling:  Smoking cessation counseling was provided.  Approximately 3 minutes were spent discussing the  rationale for tobacco cessation and strategies for doing so.    . Orders and follow up as documented in [place], reviewed diet, exercise and weight control, cardiovascular risk and specific lipid/LDL goals reviewed, reviewed medications and side effects in detail.  . Reviewed expectations re: course of current medical issues. . Outlined signs and symptoms indicating need for more acute intervention. . Patient verbalized understanding and all questions were answered. . Patient received an After Visit Summary.  CMA served as Education administrator during this visit. History, Physical, and Plan performed by medical provider. The above documentation has been reviewed and is accurate and complete. Briscoe Deutscher, D.O.  Briscoe Deutscher, DO [place], [facility] 08/09/2018

## 2018-08-14 LAB — RETICULIN ANTIBODIES, IGA W TITER: Reticulin IGA Screen: NEGATIVE

## 2018-08-14 LAB — GLIADIN ANTIBODIES, SERUM
Gliadin IgA: 6 Units
Gliadin IgG: 2 Units

## 2018-08-14 LAB — TISSUE TRANSGLUTAMINASE, IGA: (tTG) Ab, IgA: 1 U/mL

## 2018-10-31 ENCOUNTER — Other Ambulatory Visit: Payer: Self-pay | Admitting: Family Medicine

## 2018-10-31 DIAGNOSIS — F331 Major depressive disorder, recurrent, moderate: Secondary | ICD-10-CM

## 2018-11-03 ENCOUNTER — Other Ambulatory Visit: Payer: Self-pay | Admitting: Family Medicine

## 2018-11-03 DIAGNOSIS — F909 Attention-deficit hyperactivity disorder, unspecified type: Secondary | ICD-10-CM

## 2018-11-03 NOTE — Telephone Encounter (Signed)
See note

## 2018-11-03 NOTE — Telephone Encounter (Signed)
Copied from [id number]. Topic: Quick Communication - Rx Refill/Question >> Nov 03, 2018 12:47 PM Scherrie Gerlach wrote: Medication: amphetamine-dextroamphetamine (ADDERALL) 20 MG tablet  Provider had to cancel the pt's appt on 3/18. Pt would like to know if she can get a 30 day to get he through to her appt 11/27/2018.  She will run out of med on 11/08/18

## 2018-11-03 NOTE — Telephone Encounter (Signed)
Please see msg and advise.  

## 2018-11-04 MED ORDER — AMPHETAMINE-DEXTROAMPHETAMINE 20 MG PO TABS: 20.0000 mg | ORAL_TABLET | Freq: Two times a day (BID) | ORAL | 0 refills | Status: DC

## 2018-11-08 ENCOUNTER — Ambulatory Visit: Payer: 59 | Admitting: Family Medicine

## 2018-11-12 ENCOUNTER — Other Ambulatory Visit: Payer: Self-pay | Admitting: Family Medicine

## 2018-11-12 DIAGNOSIS — F331 Major depressive disorder, recurrent, moderate: Secondary | ICD-10-CM

## 2018-11-13 ENCOUNTER — Other Ambulatory Visit: Payer: Self-pay | Admitting: Family Medicine

## 2018-11-13 DIAGNOSIS — N643 Galactorrhea not associated with childbirth: Secondary | ICD-10-CM

## 2018-11-13 NOTE — Telephone Encounter (Signed)
Last OV 08/19/18 Last refill 10/20/17 #15/11 Next OV 11/27/2018

## 2018-11-21 ENCOUNTER — Telehealth: Payer: Self-pay | Admitting: *Deleted

## 2018-11-21 NOTE — Telephone Encounter (Signed)
Agreed to [employer]. 

## 2018-11-26 NOTE — Progress Notes (Signed)
Virtual Visit via Video   I connected with Veronica Gray on 11/27/18 at  9:40 AM EDT by a video enabled telemedicine application and verified that I am speaking with the correct person using two identifiers. Location patient: Home Location provider: [facility], Office Persons participating in the virtual visit: Yamaira Novitski, Briscoe Deutscher, DO [name], CMA acting as scribe for Dr. Briscoe Deutscher.   I discussed the limitations of evaluation and management by telemedicine and the availability of in person appointments. The patient expressed understanding and agreed to proceed.  Subjective:   HPI: ADHD: Since the last visit has the patient had any:  Appetite changes? No Unintentional weight loss? No Is medication working well ? Yes , but having more distractions when working at home.  Does patient take drug holidays? Yes  Difficulties falling to sleep or maintaining sleep? No Any anxiety?  No Any cardiac issues (fainting or paliptations)? No Suicidal thoughts? No Changes in health since last visit? No New medications? No Any illicit substance abuse? No Has the patient taken his medication today? No    She does have follow up with endocrinology in November.  Reviewed all precautions and expectations with prevention of Covid-19. Working on getting out more during the day. She is having hard time with that due to working from home.    ROS: See pertinent positives and negatives per HPI.  Patient Active Problem List   Diagnosis Date Noted  . Morbid obesity with BMI of 45.0-49.9, adult ([place]) 08/09/2018  . Lactose intolerance 08/09/2018  . Vitamin D deficiency 08/09/2018  . Cigarette nicotine dependence without complication [id number]/9509  . Galactorrhea 05/20/2017  . Pituitary adenoma ([place]) 05/20/2017  . ADD (attention deficit disorder) 05/20/2017  . Headache 02/25/2017  . Weight gain 02/25/2017  . Depression 02/25/2017  . Anxiety 02/25/2017  . Growth disorder  02/25/2017    Social History   Tobacco Use  . Smoking status: Current Every Day Smoker    Packs/day: 0.50    Types: Cigarettes  . Smokeless tobacco: Never Used  Substance Use Topics  . Alcohol use: Yes    Comment: occasionally    Current Outpatient Medications:  .  amphetamine-dextroamphetamine (ADDERALL) 20 MG tablet, Take 1 tablet (20 mg total) by mouth 2 (two) times daily for 30 days., Disp: 60 tablet, Rfl: 0 .  bromocriptine (PARLODEL) 2.5 MG tablet, TAKE 1/2 TABLET(1.25 MG) BY MOUTH AT BEDTIME, Disp: 15 tablet, Rfl: 5 .  venlafaxine XR (EFFEXOR-XR) 150 MG 24 hr capsule, TAKE 1 CAPSULE DAILY WITH BREAKFAST. TO BE TAKEN WITH 75 MG CAPSULE, Disp: 90 capsule, Rfl: 0 .  venlafaxine XR (EFFEXOR-XR) 75 MG 24 hr capsule, Take one daily WITH Effexor 150 mg., Disp: 90 capsule, Rfl: 1 .  amphetamine-dextroamphetamine (ADDERALL) 20 MG tablet, Take 1 tablet (20 mg total) by mouth 2 (two) times daily for 30 days., Disp: 60 tablet, Rfl: 0 .  amphetamine-dextroamphetamine (ADDERALL) 20 MG tablet, Take 1 tablet (20 mg total) by mouth 2 (two) times daily for 30 days., Disp: 60 tablet, Rfl: 0 .  amphetamine-dextroamphetamine (ADDERALL) 20 MG tablet, Take 1 tablet (20 mg total) by mouth 2 (two) times daily for 30 days., Disp: 60 tablet, Rfl: 0 .  levonorgestrel (MIRENA) 20 MCG/24HR IUD, 1 each by Intrauterine route once., Disp: , Rfl:   Allergies  Allergen Reactions  . Wellbutrin [Bupropion]     Objective:   VITALS: Per patient if applicable, see vitals. GENERAL: Alert, appears well and in no acute distress. HEENT:  Atraumatic, conjunctiva clear, no obvious abnormalities on inspection of external nose and ears. NECK: Normal movements of the head and neck. CARDIOPULMONARY: No increased WOB. Speaking in clear sentences. I:E ratio WNL.  MS: Moves all visible extremities without noticeable abnormality. PSYCH: Pleasant and cooperative, well-groomed. Speech normal rate and rhythm. Affect is  appropriate. Insight and judgement are appropriate. Attention is focused, linear, and appropriate.  NEURO: CN grossly intact. Oriented as arrived to appointment on time with no prompting. Moves both UE equally.  SKIN: No obvious lesions, wounds, erythema, or cyanosis noted on face or hands.  Assessment and Plan:   Veronica Gray was seen today for follow-up.  Diagnoses and all orders for this visit:  Attention deficit hyperactivity disorder (ADHD), unspecified ADHD type Comments: Doing well on current dose.  Moderate episode of recurrent major depressive disorder (HCC) Comments: Continue Wellbutrin at 150 + 75 mg daily.  Orders: -     venlafaxine XR (EFFEXOR-XR) 150 MG 24 hr capsule; TAKE 1 CAPSULE DAILY WITH BREAKFAST. TO BE TAKEN WITH 75 MG CAPSULE -     venlafaxine XR (EFFEXOR-XR) 75 MG 24 hr capsule; Take one daily WITH Effexor 150 mg.  Morbid obesity with BMI of 45.0-49.9, adult [facility]) Comments: Walking more.  Cigarette nicotine dependence without complication Comments: Discussed cessation.  Pituitary adenoma ([place]) Comments: Will need MRI repeated at next visit.   Other orders -     amphetamine-dextroamphetamine (ADDERALL) 20 MG tablet; Take 1 tablet (20 mg total) by mouth 2 (two) times daily for 30 days. -     amphetamine-dextroamphetamine (ADDERALL) 20 MG tablet; Take 1 tablet (20 mg total) by mouth 2 (two) times daily for 30 days. -     amphetamine-dextroamphetamine (ADDERALL) 20 MG tablet; Take 1 tablet (20 mg total) by mouth 2 (two) times daily for 30 days.    . Reviewed expectations re: course of current medical issues. . Discussed self-management of symptoms. . Outlined signs and symptoms indicating need for more acute intervention. . Patient verbalized understanding and all questions were answered. [name] Health Maintenance issues including appropriate healthy diet, exercise, and smoking avoidance were discussed with patient. . See orders for this visit as documented in  the electronic medical record.  Briscoe Deutscher, DO 11/27/2018

## 2018-11-27 ENCOUNTER — Other Ambulatory Visit: Payer: Self-pay

## 2018-11-27 ENCOUNTER — Ambulatory Visit (INDEPENDENT_AMBULATORY_CARE_PROVIDER_SITE_OTHER): Payer: Managed Care, Other (non HMO) | Admitting: Family Medicine

## 2018-11-27 ENCOUNTER — Encounter: Payer: Self-pay | Admitting: Family Medicine

## 2018-11-27 VITALS — Temp 97.4°F | Ht 65.0 in | Wt 286.0 lb

## 2018-11-27 DIAGNOSIS — F909 Attention-deficit hyperactivity disorder, unspecified type: Secondary | ICD-10-CM

## 2018-11-27 DIAGNOSIS — F331 Major depressive disorder, recurrent, moderate: Secondary | ICD-10-CM | POA: Diagnosis not present

## 2018-11-27 DIAGNOSIS — D352 Benign neoplasm of pituitary gland: Secondary | ICD-10-CM

## 2018-11-27 DIAGNOSIS — F17210 Nicotine dependence, cigarettes, uncomplicated: Secondary | ICD-10-CM

## 2018-11-27 DIAGNOSIS — Z6841 Body Mass Index (BMI) 40.0 and over, adult: Secondary | ICD-10-CM

## 2018-11-27 MED ORDER — AMPHETAMINE-DEXTROAMPHETAMINE 20 MG PO TABS: 20.0000 mg | ORAL_TABLET | Freq: Two times a day (BID) | ORAL | 0 refills | Status: DC

## 2018-11-27 MED ORDER — VENLAFAXINE HCL ER 75 MG PO CP24: ORAL_CAPSULE | ORAL | 1 refills | Status: DC

## 2018-11-27 MED ORDER — VENLAFAXINE HCL ER 150 MG PO CP24: ORAL_CAPSULE | ORAL | 0 refills | Status: DC

## 2019-02-09 ENCOUNTER — Other Ambulatory Visit: Payer: Self-pay | Admitting: Family Medicine

## 2019-02-09 MED ORDER — AMPHETAMINE-DEXTROAMPHETAMINE 20 MG PO TABS: 20.0000 mg | ORAL_TABLET | Freq: Two times a day (BID) | ORAL | 0 refills | Status: DC

## 2019-02-09 NOTE — Telephone Encounter (Signed)
Pt requesting refill on Adderall. Last OV 11/27/2018, Last Rx was 5/6 # 60. Pt takes BID. Scheduled to come in 7/14 with you.

## 2019-03-05 NOTE — Progress Notes (Signed)
Virtual Visit via Video   Due to the COVID-19 pandemic, this visit was completed with telemedicine (audio/video) technology to reduce patient and provider exposure as well as to preserve personal protective equipment.   I connected with Veronica Gray by a video enabled telemedicine application and verified that I am speaking with the correct person using two identifiers. Location patient: Home Location provider: [facility], Office Persons participating in the virtual visit: Aften Prevette, Briscoe Deutscher, DO   I discussed the limitations of evaluation and management by telemedicine and the availability of in person appointments. The patient expressed understanding and agreed to proceed.  Care Team   Patient Care Team: Briscoe Deutscher, DO as PCP - General (Family Medicine)  Subjective:   HPI:   ADHD Patient currently taking Adderall 20mg  twice a day.   Since the last visit has the patient had any:  Appetite changes? No Unintentional weight loss? No Is medication working well ? Yes Does patient take drug holidays? No Difficulties falling to sleep or maintaining sleep? No Any anxiety?  No Any cardiac issues (fainting or paliptations)? No Suicidal thoughts? No Changes in health since last visit? No New medications? No Any illicit substance abuse? No Has the patient taken his medication today? No   Anxiety: Current symptoms: none. No current suicidal and homicidal ideation. Side effects from treatment: none.   Review of Systems  Constitutional: Negative for chills, fever, malaise/fatigue and weight loss.  Respiratory: Negative for cough, shortness of breath and wheezing.   Cardiovascular: Negative for chest pain, palpitations and leg swelling.  Gastrointestinal: Negative for abdominal pain, constipation, diarrhea, nausea and vomiting.  Genitourinary: Negative for dysuria and urgency.  Musculoskeletal: Negative for joint pain and myalgias.  Skin: Negative for rash.    Neurological: Negative for dizziness and headaches.  Psychiatric/Behavioral: Negative for depression, substance abuse and suicidal ideas. The patient is not nervous/anxious.     Patient Active Problem List   Diagnosis Date Noted  . Morbid obesity with BMI of 45.0-49.9, adult ([place]) 08/09/2018  . Lactose intolerance 08/09/2018  . Vitamin D deficiency 08/09/2018  . Former smoker, stopped March 2020 01/24/2018  . Galactorrhea 05/20/2017  . Pituitary adenoma ([place]) 05/20/2017  . ADD (attention deficit disorder) 05/20/2017  . Headache 02/25/2017  . Weight gain 02/25/2017  . Depression 02/25/2017  . Anxiety 02/25/2017  . Growth disorder 02/25/2017    Social History   Tobacco Use  . Smoking status: Current Every Day Smoker    Packs/day: 0.50    Types: Cigarettes  . Smokeless tobacco: Never Used  Substance Use Topics  . Alcohol use: Yes    Comment: occasionally   Current Outpatient Medications:  .  amphetamine-dextroamphetamine (ADDERALL) 20 MG tablet, Take 1 tablet (20 mg total) by mouth 2 (two) times daily for 30 days., Disp: 60 tablet, Rfl: 0 .  amphetamine-dextroamphetamine (ADDERALL) 20 MG tablet, Take 1 tablet (20 mg total) by mouth 2 (two) times daily for 30 days., Disp: 60 tablet, Rfl: 0 .  amphetamine-dextroamphetamine (ADDERALL) 20 MG tablet, Take 1 tablet (20 mg total) by mouth 2 (two) times daily for 30 days., Disp: 60 tablet, Rfl: 0 .  amphetamine-dextroamphetamine (ADDERALL) 20 MG tablet, Take 1 tablet (20 mg total) by mouth 2 (two) times daily for 30 days., Disp: 60 tablet, Rfl: 0 .  bromocriptine (PARLODEL) 2.5 MG tablet, TAKE 1/2 TABLET(1.25 MG) BY MOUTH AT BEDTIME, Disp: 15 tablet, Rfl: 5 .  levonorgestrel (MIRENA) 20 MCG/24HR IUD, 1 each by Intrauterine route once., Disp: ,  Rfl:  .  venlafaxine XR (EFFEXOR-XR) 150 MG 24 hr capsule, TAKE 1 CAPSULE DAILY WITH BREAKFAST. TO BE TAKEN WITH 75 MG CAPSULE, Disp: 90 capsule, Rfl: 0 .  venlafaxine XR (EFFEXOR-XR) 75 MG 24 hr  capsule, Take one daily WITH Effexor 150 mg., Disp: 90 capsule, Rfl: 1  Allergies  Allergen Reactions  . Wellbutrin [Bupropion]    Objective:   VITALS: Per patient if applicable, see vitals. GENERAL: Alert, appears well and in no acute distress. HEENT: Atraumatic, conjunctiva clear, no obvious abnormalities on inspection of external nose and ears. NECK: Normal movements of the head and neck. CARDIOPULMONARY: No increased WOB. Speaking in clear sentences. I:E ratio WNL.  MS: Moves all visible extremities without noticeable abnormality. PSYCH: Pleasant and cooperative, well-groomed. Speech normal rate and rhythm. Affect is appropriate. Insight and judgement are appropriate. Attention is focused, linear, and appropriate.  NEURO: CN grossly intact. Oriented as arrived to appointment on time with no prompting. Moves both UE equally.  SKIN: No obvious lesions, wounds, erythema, or cyanosis noted on face or hands.  Depression screen [facility] 2/9 03/06/2019 08/09/2018 01/24/2018  Decreased Interest 0 0 0  Down, Depressed, Hopeless 1 0 0  PHQ - 2 Score 1 0 0  Altered sleeping 1 0 1  Tired, decreased energy 3 0 1  Change in appetite 0 0 0  Feeling bad or failure about yourself  0 0 1  Trouble concentrating 0 0 0  Moving slowly or fidgety/restless 0 0 0  Suicidal thoughts 0 0 0  PHQ-9 Score 5 0 3  Difficult doing work/chores Somewhat difficult - Somewhat difficult    Assessment and Plan:   Veronica Gray was seen today for adhd.  Diagnoses and all orders for this visit:  Attention deficit hyperactivity disorder (ADHD), unspecified ADHD type -     amphetamine-dextroamphetamine (ADDERALL) 20 MG tablet; Take 1 tablet (20 mg total) by mouth 2 (two) times daily. -     amphetamine-dextroamphetamine (ADDERALL) 20 MG tablet; Take 1 tablet (20 mg total) by mouth 2 (two) times daily. -     amphetamine-dextroamphetamine (ADDERALL) 20 MG tablet; Take 1 tablet (20 mg total) by mouth 2 (two) times  daily.  Anxiety  Morbid obesity with BMI of 45.0-49.9, adult (HCC)  Hyperglycemia -     Hemoglobin A1c; Future  Screening for lipid disorders -     Lipid panel; Future  Former smoker, stopped March 2020  Pituitary adenoma [facility]) Comments: Due for repeat MRI. Orders: -     CBC with Differential/Platelet; Future -     Comprehensive metabolic panel; Future -     MR Brain W Wo Contrast; Future   . COVID-19 Education: The signs and symptoms of COVID-19 were discussed with the patient and how to seek care for testing if needed. The importance of social distancing was discussed today. . Reviewed expectations re: course of current medical issues. . Discussed self-management of symptoms. . Outlined signs and symptoms indicating need for more acute intervention. . Patient verbalized understanding and all questions were answered. [name] Health Maintenance issues including appropriate healthy diet, exercise, and smoking avoidance were discussed with patient. . See orders for this visit as documented in the electronic medical record.  Briscoe Deutscher, DO  Records requested if needed. Time spent: 25 minutes, of which >50% was spent in obtaining information about her symptoms, reviewing her previous labs, evaluations, and treatments, counseling her about her condition (please see the discussed topics above), and developing a plan to further investigate  it; she had a number of questions which I addressed.

## 2019-03-06 ENCOUNTER — Ambulatory Visit (INDEPENDENT_AMBULATORY_CARE_PROVIDER_SITE_OTHER): Payer: Managed Care, Other (non HMO) | Admitting: Family Medicine

## 2019-03-06 ENCOUNTER — Encounter: Payer: Self-pay | Admitting: Family Medicine

## 2019-03-06 ENCOUNTER — Other Ambulatory Visit: Payer: Self-pay

## 2019-03-06 VITALS — Ht 65.0 in | Wt 286.0 lb

## 2019-03-06 DIAGNOSIS — R739 Hyperglycemia, unspecified: Secondary | ICD-10-CM

## 2019-03-06 DIAGNOSIS — Z13220 Encounter for screening for lipoid disorders: Secondary | ICD-10-CM

## 2019-03-06 DIAGNOSIS — F909 Attention-deficit hyperactivity disorder, unspecified type: Secondary | ICD-10-CM | POA: Diagnosis not present

## 2019-03-06 DIAGNOSIS — Z87891 Personal history of nicotine dependence: Secondary | ICD-10-CM

## 2019-03-06 DIAGNOSIS — F419 Anxiety disorder, unspecified: Secondary | ICD-10-CM | POA: Diagnosis not present

## 2019-03-06 DIAGNOSIS — D352 Benign neoplasm of pituitary gland: Secondary | ICD-10-CM

## 2019-03-06 DIAGNOSIS — Z6841 Body Mass Index (BMI) 40.0 and over, adult: Secondary | ICD-10-CM

## 2019-03-06 MED ORDER — AMPHETAMINE-DEXTROAMPHETAMINE 20 MG PO TABS: 20.0000 mg | ORAL_TABLET | Freq: Two times a day (BID) | ORAL | 0 refills | Status: DC

## 2019-03-08 ENCOUNTER — Other Ambulatory Visit: Payer: Self-pay | Admitting: Family Medicine

## 2019-03-08 DIAGNOSIS — F331 Major depressive disorder, recurrent, moderate: Secondary | ICD-10-CM

## 2019-03-16 ENCOUNTER — Other Ambulatory Visit: Payer: Self-pay

## 2019-03-16 ENCOUNTER — Other Ambulatory Visit (INDEPENDENT_AMBULATORY_CARE_PROVIDER_SITE_OTHER): Payer: Managed Care, Other (non HMO)

## 2019-03-16 DIAGNOSIS — R739 Hyperglycemia, unspecified: Secondary | ICD-10-CM

## 2019-03-16 DIAGNOSIS — D352 Benign neoplasm of pituitary gland: Secondary | ICD-10-CM | POA: Diagnosis not present

## 2019-03-16 DIAGNOSIS — Z13220 Encounter for screening for lipoid disorders: Secondary | ICD-10-CM

## 2019-03-16 LAB — CBC WITH DIFFERENTIAL/PLATELET
Basophils Absolute: 0.1 10*3/uL (ref 0.0–0.1)
Basophils Relative: 1 % (ref 0.0–3.0)
Eosinophils Absolute: 0.1 10*3/uL (ref 0.0–0.7)
Eosinophils Relative: 1.7 % (ref 0.0–5.0)
HCT: 37.4 % (ref 36.0–46.0)
Hemoglobin: 12.2 g/dL (ref 12.0–15.0)
Lymphocytes Relative: 39.4 % (ref 12.0–46.0)
Lymphs Abs: 3.4 10*3/uL (ref 0.7–4.0)
MCHC: 32.6 g/dL (ref 30.0–36.0)
MCV: 86 fl (ref 78.0–100.0)
Monocytes Absolute: 0.5 10*3/uL (ref 0.1–1.0)
Monocytes Relative: 6.2 % (ref 3.0–12.0)
Neutro Abs: 4.4 10*3/uL (ref 1.4–7.7)
Neutrophils Relative %: 51.7 % (ref 43.0–77.0)
Platelets: 324 10*3/uL (ref 150.0–400.0)
RBC: 4.35 Mil/uL (ref 3.87–5.11)
RDW: 13.4 % (ref 11.5–15.5)
WBC: 8.6 10*3/uL (ref 4.0–10.5)

## 2019-03-16 LAB — COMPREHENSIVE METABOLIC PANEL
ALT: 25 U/L (ref 0–35)
AST: 23 U/L (ref 0–37)
Albumin: 4.2 g/dL (ref 3.5–5.2)
Alkaline Phosphatase: 95 U/L (ref 39–117)
BUN: 12 mg/dL (ref 6–23)
CO2: 28 mEq/L (ref 19–32)
Calcium: 9.4 mg/dL (ref 8.4–10.5)
Chloride: 100 mEq/L (ref 96–112)
Creatinine, Ser: 0.64 mg/dL (ref 0.40–1.20)
GFR: 113.18 mL/min (ref >60.00)
Glucose, Bld: 92 mg/dL (ref 70–99)
Potassium: 4 mEq/L (ref 3.5–5.1)
Sodium: 137 mEq/L (ref 135–145)
Total Bilirubin: 0.3 mg/dL (ref 0.2–1.2)
Total Protein: 7 g/dL (ref 6.0–8.3)

## 2019-03-16 LAB — LIPID PANEL
Cholesterol: 174 mg/dL (ref 0–200)
HDL: 45.4 mg/dL (ref >39.00)
NonHDL: 128.88
Total CHOL/HDL Ratio: 4
Triglycerides: 354 mg/dL — ABNORMAL HIGH (ref 0.0–149.0)
VLDL: 70.8 mg/dL — ABNORMAL HIGH (ref 0.0–40.0)

## 2019-03-16 LAB — HEMOGLOBIN A1C: Hgb A1c MFr Bld: 5.6 % (ref 4.6–6.5)

## 2019-03-16 LAB — LDL CHOLESTEROL, DIRECT: Direct LDL: 99 mg/dL

## 2019-04-13 ENCOUNTER — Ambulatory Visit (HOSPITAL_COMMUNITY): Admission: RE | Admit: 2019-04-13 | Payer: Managed Care, Other (non HMO) | Source: Ambulatory Visit

## 2019-04-27 ENCOUNTER — Other Ambulatory Visit: Payer: Self-pay

## 2019-04-27 ENCOUNTER — Telehealth: Payer: Self-pay | Admitting: Physical Therapy

## 2019-04-27 ENCOUNTER — Ambulatory Visit (HOSPITAL_COMMUNITY)
Admission: RE | Admit: 2019-04-27 | Discharge: 2019-04-27 | Disposition: A | Discharge status: Home / Self Care | Payer: Managed Care, Other (non HMO) | Source: Ambulatory Visit | Attending: Family Medicine | Admitting: Family Medicine

## 2019-04-27 DIAGNOSIS — D352 Benign neoplasm of pituitary gland: Secondary | ICD-10-CM

## 2019-04-27 MED ORDER — GADOBUTROL 1 MMOL/ML IV SOLN: 10.0000 mL | Freq: Once | INTRAVENOUS | Status: DC | PRN

## 2019-04-27 NOTE — Telephone Encounter (Signed)
Yes please - for [id number]. Thank you.

## 2019-04-27 NOTE — Telephone Encounter (Signed)
Copied from [place] [id number]. Topic: Referral - Status >> Apr 27, 2019  3:10 PM Veronica Gray wrote: Reason for CRM: Pt was scheduled to have an MRI done at [facility], but she was not able to fit in the machine and she wants to have another one scheduled with [facility] imaging / please advise

## 2019-04-27 NOTE — Telephone Encounter (Signed)
Please advise.  Will a new order need to be placed?

## 2019-05-01 ENCOUNTER — Other Ambulatory Visit: Payer: Self-pay

## 2019-05-01 DIAGNOSIS — D352 Benign neoplasm of pituitary gland: Secondary | ICD-10-CM

## 2019-05-01 NOTE — Telephone Encounter (Signed)
Order has been placed.

## 2019-05-28 ENCOUNTER — Other Ambulatory Visit: Payer: Self-pay

## 2019-05-28 ENCOUNTER — Ambulatory Visit
Admission: RE | Admit: 2019-05-28 | Discharge: 2019-05-28 | Disposition: A | Discharge status: Home / Self Care | Payer: Managed Care, Other (non HMO) | Source: Ambulatory Visit | Attending: Family Medicine | Admitting: Family Medicine

## 2019-05-28 DIAGNOSIS — D352 Benign neoplasm of pituitary gland: Secondary | ICD-10-CM

## 2019-05-28 MED ORDER — GADOBENATE DIMEGLUMINE 529 MG/ML IV SOLN
10.0000 mL | Freq: Once | INTRAVENOUS | Status: AC | PRN
  Administered 2019-05-28: 10 mL via INTRAVENOUS

## 2019-06-13 ENCOUNTER — Encounter: Payer: Managed Care, Other (non HMO) | Admitting: Family Medicine

## 2019-06-13 DIAGNOSIS — E781 Pure hyperglyceridemia: Secondary | ICD-10-CM | POA: Insufficient documentation

## 2019-06-13 DIAGNOSIS — Z975 Presence of (intrauterine) contraceptive device: Secondary | ICD-10-CM | POA: Insufficient documentation

## 2019-06-13 NOTE — Progress Notes (Signed)
NO SHOW

## 2019-06-18 ENCOUNTER — Encounter: Payer: Self-pay | Admitting: Family Medicine

## 2019-07-03 ENCOUNTER — Ambulatory Visit: Payer: 59 | Admitting: Endocrinology

## 2019-07-04 ENCOUNTER — Encounter: Payer: Managed Care, Other (non HMO) | Admitting: Physician Assistant

## 2019-07-10 ENCOUNTER — Encounter: Payer: Self-pay | Admitting: Physician Assistant

## 2019-07-10 ENCOUNTER — Ambulatory Visit (INDEPENDENT_AMBULATORY_CARE_PROVIDER_SITE_OTHER): Payer: Managed Care, Other (non HMO) | Admitting: Physician Assistant

## 2019-07-10 VITALS — Ht 65.0 in | Wt 280.0 lb

## 2019-07-10 DIAGNOSIS — E781 Pure hyperglyceridemia: Secondary | ICD-10-CM

## 2019-07-10 DIAGNOSIS — Z6841 Body Mass Index (BMI) 40.0 and over, adult: Secondary | ICD-10-CM | POA: Diagnosis not present

## 2019-07-10 DIAGNOSIS — D649 Anemia, unspecified: Secondary | ICD-10-CM | POA: Insufficient documentation

## 2019-07-10 NOTE — Progress Notes (Signed)
Virtual Visit via Video   I connected with Veronica Gray on 07/10/19 at  3:40 PM EST by a video enabled telemedicine application and verified that I am speaking with the correct person using two identifiers. Location patient: Home Location provider: [facility], Office Persons participating in the virtual visit: Rainell Losoya, Inda Coke PA-C, Anselmo Pickler, LPN   I discussed the limitations of evaluation and management by telemedicine and the availability of in person appointments. The patient expressed understanding and agreed to proceed.  I acted as a Education administrator for [employer], PA-C [employer], LPN  Subjective:   HPI:  Pt is transferring care today from Dr. Juleen Gray.  Patient is interested in working on weight loss. Body mass index is 46.59 kg/m.  She is currently obese and has been working with Dr. Juleen Gray on other health goals, but is now interested in working on weight loss.  She states that at one point Dr. Juleen Gray had recommended a weekly injectable for her.  She struggles with overeating and comfort eating.  She has never been on medications for weight loss.  She is currently prescribed Adderall from her psychiatrist.  She was also found to have elevated triglycerides on lipid panel in March 16, 2019.  She is due for recheck at this point.   ROS: See pertinent positives and negatives per HPI.  Patient Active Problem List   Diagnosis Date Noted  . Anemia   . IUD (intrauterine device) in place 06/13/2019  . Hypertriglyceridemia 06/13/2019  . Morbid obesity with BMI of 45.0-49.9, adult ([place]) 08/09/2018  . Lactose intolerance 08/09/2018  . Vitamin D deficiency 08/09/2018  . History of pituitary adenoma ([place]) 05/20/2017  . ADD (attention deficit disorder), Rx Adderall 05/20/2017  . Current smoker 02/27/2017  . Depression 02/25/2017  . Anxiety 02/25/2017    Social History   Tobacco Use  . Smoking status: Current Every Day Smoker    Packs/day: 0.50   Types: E-cigarettes  . Smokeless tobacco: Never Used  Substance Use Topics  . Alcohol use: Yes    Comment: occasionally    Current Outpatient Medications:  .  ABILIFY 2 MG tablet, Take 2 mg by mouth daily., Disp: , Rfl:  .  ADDERALL 10 MG tablet, Take 10 mg by mouth 2 (two) times daily., Disp: , Rfl:  .  levonorgestrel (MIRENA) 20 MCG/24HR IUD, 1 each by Intrauterine route once., Disp: , Rfl:  .  venlafaxine XR (EFFEXOR-XR) 150 MG 24 hr capsule, TAKE ONE CAPSULE BY MOUTH EVERY DAY WITH BREAKFAST, TAKE WITH 75 MG CAPSULE, Disp: 90 capsule, Rfl: 1 .  venlafaxine XR (EFFEXOR-XR) 75 MG 24 hr capsule, Take one daily WITH Effexor 150 mg., Disp: 90 capsule, Rfl: 1  Allergies  Allergen Reactions  . Wellbutrin [Bupropion]     Objective:   VITALS: Per patient if applicable, see vitals. GENERAL: Alert, appears well and in no acute distress. HEENT: Atraumatic, conjunctiva clear, no obvious abnormalities on inspection of external nose and ears. NECK: Normal movements of the head and neck. CARDIOPULMONARY: No increased WOB. Speaking in clear sentences. I:E ratio WNL.  MS: Moves all visible extremities without noticeable abnormality. PSYCH: Pleasant and cooperative, well-groomed. Speech normal rate and rhythm. Affect is appropriate. Insight and judgement are appropriate. Attention is focused, linear, and appropriate.  NEURO: CN grossly intact. Oriented as arrived to appointment on time with no prompting. Moves both UE equally.  SKIN: No obvious lesions, wounds, erythema, or cyanosis noted on face or hands.  Assessment and  Plan:   [name] was seen today for transfer of care.  Diagnoses and all orders for this visit:  Morbid obesity with BMI of 45.0-49.9, adult ([place]) I think she would be a great candidate for a GLP-1 injectable, such as Ozempic. Follow-up appointment was made for her this Friday for her to come in for labs and possibly given a Ozempic sample at that time, and to teach how to use  if warranted. -     CBC with Differential; Future -     Comprehensive metabolic panel; Future -     POCT HgB A1C; Future  Hypertriglyceridemia Recheck lipid panel on Friday. -     Lipid panel; Future  . Reviewed expectations re: course of current medical issues. . Discussed self-management of symptoms. . Outlined signs and symptoms indicating need for more acute intervention. . Patient verbalized understanding and all questions were answered. Veronica Gray Health Maintenance issues including appropriate healthy diet, exercise, and smoking avoidance were discussed with patient. . See orders for this visit as documented in the electronic medical record.  I discussed the assessment and treatment plan with the patient. The patient was provided an opportunity to ask questions and all were answered. The patient agreed with the plan and demonstrated an understanding of the instructions.   The patient was advised to call back or seek an in-person evaluation if the symptoms worsen or if the condition fails to improve as anticipated.   CMA or LPN served as scribe during this visit. History, Physical, and Plan performed by medical provider. The above documentation has been reviewed and is accurate and complete.  [place], [place] 07/10/2019

## 2019-07-13 ENCOUNTER — Ambulatory Visit (INDEPENDENT_AMBULATORY_CARE_PROVIDER_SITE_OTHER): Payer: Managed Care, Other (non HMO) | Admitting: Physician Assistant

## 2019-07-13 ENCOUNTER — Other Ambulatory Visit: Payer: Self-pay

## 2019-07-13 ENCOUNTER — Encounter: Payer: Self-pay | Admitting: Physician Assistant

## 2019-07-13 VITALS — BP 130/90 | HR 95 | Temp 97.8°F | Ht 65.0 in | Wt 296.5 lb

## 2019-07-13 DIAGNOSIS — Z23 Encounter for immunization: Secondary | ICD-10-CM

## 2019-07-13 DIAGNOSIS — Z6841 Body Mass Index (BMI) 40.0 and over, adult: Secondary | ICD-10-CM

## 2019-07-13 DIAGNOSIS — E781 Pure hyperglyceridemia: Secondary | ICD-10-CM

## 2019-07-13 DIAGNOSIS — R739 Hyperglycemia, unspecified: Secondary | ICD-10-CM

## 2019-07-13 LAB — CBC WITH DIFFERENTIAL/PLATELET
Basophils Absolute: 0.1 10*3/uL (ref 0.0–0.1)
Basophils Relative: 0.7 % (ref 0.0–3.0)
Eosinophils Absolute: 0.2 10*3/uL (ref 0.0–0.7)
Eosinophils Relative: 2.2 % (ref 0.0–5.0)
HCT: 37.2 % (ref 36.0–46.0)
Hemoglobin: 12.3 g/dL (ref 12.0–15.0)
Lymphocytes Relative: 37.6 % (ref 12.0–46.0)
Lymphs Abs: 3.3 10*3/uL (ref 0.7–4.0)
MCHC: 33 g/dL (ref 30.0–36.0)
MCV: 85.8 fl (ref 78.0–100.0)
Monocytes Absolute: 0.6 10*3/uL (ref 0.1–1.0)
Monocytes Relative: 6.5 % (ref 3.0–12.0)
Neutro Abs: 4.7 10*3/uL (ref 1.4–7.7)
Neutrophils Relative %: 53 % (ref 43.0–77.0)
Platelets: 331 10*3/uL (ref 150.0–400.0)
RBC: 4.33 Mil/uL (ref 3.87–5.11)
RDW: 13.5 % (ref 11.5–15.5)
WBC: 8.8 10*3/uL (ref 4.0–10.5)

## 2019-07-13 LAB — COMPREHENSIVE METABOLIC PANEL
ALT: 23 U/L (ref 0–35)
AST: 21 U/L (ref 0–37)
Albumin: 4 g/dL (ref 3.5–5.2)
Alkaline Phosphatase: 89 U/L (ref 39–117)
BUN: 12 mg/dL (ref 6–23)
CO2: 29 mEq/L (ref 19–32)
Calcium: 9.1 mg/dL (ref 8.4–10.5)
Chloride: 103 mEq/L (ref 96–112)
Creatinine, Ser: 0.59 mg/dL (ref 0.40–1.20)
GFR: 123.99 mL/min (ref >60.00)
Glucose, Bld: 101 mg/dL — ABNORMAL HIGH (ref 70–99)
Potassium: 4.5 mEq/L (ref 3.5–5.1)
Sodium: 140 mEq/L (ref 135–145)
Total Bilirubin: 0.3 mg/dL (ref 0.2–1.2)
Total Protein: 7 g/dL (ref 6.0–8.3)

## 2019-07-13 LAB — POCT GLYCOSYLATED HEMOGLOBIN (HGB A1C): Hemoglobin A1C: 5 % — AB (ref 4.0–5.6)

## 2019-07-13 LAB — LIPID PANEL
Cholesterol: 162 mg/dL (ref 0–200)
HDL: 44.7 mg/dL (ref >39.00)
NonHDL: 116.82
Total CHOL/HDL Ratio: 4
Triglycerides: 208 mg/dL — ABNORMAL HIGH (ref 0.0–149.0)
VLDL: 41.6 mg/dL — ABNORMAL HIGH (ref 0.0–40.0)

## 2019-07-13 LAB — LDL CHOLESTEROL, DIRECT: Direct LDL: 90 mg/dL

## 2019-07-13 NOTE — Progress Notes (Signed)
Patient was seen by me virtually on 07/10/2019.  She is here for point-of-care A1c, Ozempic sample, and labs.  Lab Results  Component Value Date   HGBA1C 5.0 (A) 07/13/2019   Please see my last note for further information.  Vitals were also obtained today. Vitals:   07/13/19 0756  BP: 130/90  Pulse: 95  Temp: 97.8 F (36.6 C)  SpO2: 97%   Patient was given a sample of Ozempic and instructed to follow-up with me in 1 month.  No charge for today's visit.

## 2019-07-13 NOTE — Patient Instructions (Signed)
I will be in touch with your labs  Have a great weekend  Let's follow up virtually in 1 month, sooner if concerns

## 2019-08-13 ENCOUNTER — Encounter: Payer: Self-pay | Admitting: Physician Assistant

## 2019-08-13 ENCOUNTER — Ambulatory Visit (INDEPENDENT_AMBULATORY_CARE_PROVIDER_SITE_OTHER): Payer: Managed Care, Other (non HMO) | Admitting: Physician Assistant

## 2019-08-13 VITALS — Ht 65.0 in | Wt 283.0 lb

## 2019-08-13 DIAGNOSIS — Z6841 Body Mass Index (BMI) 40.0 and over, adult: Secondary | ICD-10-CM | POA: Diagnosis not present

## 2019-08-13 MED ORDER — OZEMPIC (0.25 OR 0.5 MG/DOSE) 2 MG/1.5ML ~~LOC~~ SOPN: 0.2500 mg | PEN_INJECTOR | SUBCUTANEOUS | 2 refills | Status: DC

## 2019-08-13 NOTE — Progress Notes (Signed)
Virtual Visit via Video   I connected with Veronica Gray on 08/13/19 at  1:40 PM EST by a video enabled telemedicine application and verified that I am speaking with the correct person using two identifiers. Location patient: Home Location provider: [facility], Office Persons participating in the virtual visit: Veronica Gray (patient), Veronica Coke PA-C, Veronica Gray, CMA   I discussed the limitations of evaluation and management by telemedicine and the availability of in person appointments. The patient expressed understanding and agreed to proceed.  I,Veronica Gray,acting as a Education administrator for [employer], PA.,have documented all relevant documentation on the behalf of Veronica Coke, PA,as directed by  Veronica Coke, PA while in the presence of Veronica Gray, [place].   Subjective:   HPI:   Patient is following-up on Ozempic sample that was given to her a month ago. She has noticed that she has been able to tell when she is full. That has caused her to be able to limit portions. She is down 13 lbs from a month ago. She has not had any side effects from the Ozempic. She is currently on the low dose. Will need refill today.    Denies diarrhea, n/v from medication. Has no issues with self-administering these.  Wt Readings from Last 5 Encounters:  08/13/19 283 lb (128.4 kg)  07/13/19 296 lb 8 oz (134.5 kg)  07/10/19 280 lb (127 kg)  03/06/19 286 lb (129.7 kg)  11/27/18 286 lb (129.7 kg)   Body mass index is 47.09 kg/m.   ROS: See pertinent positives and negatives per HPI.  Patient Active Problem List   Diagnosis Date Noted  . Anemia   . IUD (intrauterine device) in place 06/13/2019  . Hypertriglyceridemia 06/13/2019  . Morbid obesity with BMI of 45.0-49.9, adult ([place]) 08/09/2018  . Lactose intolerance 08/09/2018  . Vitamin D deficiency 08/09/2018  . History of pituitary adenoma ([place]) 05/20/2017  . ADD (attention deficit disorder), Rx Adderall 05/20/2017  .  Current smoker 02/27/2017  . Depression 02/25/2017  . Anxiety 02/25/2017    Social History   Tobacco Use  . Smoking status: Current Every Day Smoker    Packs/day: 0.50    Types: E-cigarettes  . Smokeless tobacco: Never Used  Substance Use Topics  . Alcohol use: Yes    Comment: occasionally    Current Outpatient Medications:  .  ABILIFY 2 MG tablet, Take 2 mg by mouth daily., Disp: , Rfl:  .  levonorgestrel (MIRENA) 20 MCG/24HR IUD, 1 each by Intrauterine route once., Disp: , Rfl:  .  venlafaxine XR (EFFEXOR-XR) 150 MG 24 hr capsule, TAKE ONE CAPSULE BY MOUTH EVERY DAY WITH BREAKFAST, TAKE WITH 75 MG CAPSULE, Disp: 90 capsule, Rfl: 1 .  venlafaxine XR (EFFEXOR-XR) 75 MG 24 hr capsule, Take one daily WITH Effexor 150 mg., Disp: 90 capsule, Rfl: 1 .  ADDERALL 15 MG tablet, Take 1 tablet by mouth 2 (two) times daily., Disp: , Rfl:  .  Semaglutide,0.25 or 0.5MG /DOS, (OZEMPIC, 0.25 OR 0.5 MG/DOSE,) 2 MG/1.5ML SOPN, Inject 0.25 mg into the skin once a week., Disp: 1 pen, Rfl: 2  Allergies  Allergen Reactions  . Wellbutrin [Bupropion]     Objective:   VITALS: Per patient if applicable, see vitals. GENERAL: Alert, appears well and in no acute distress. HEENT: Atraumatic, conjunctiva clear, no obvious abnormalities on inspection of external nose and ears. NECK: Normal movements of the head and neck. CARDIOPULMONARY: No increased WOB. Speaking in clear sentences. I:E ratio WNL.  MS:  Moves all visible extremities without noticeable abnormality. PSYCH: Pleasant and cooperative, well-groomed. Speech normal rate and rhythm. Affect is appropriate. Insight and judgement are appropriate. Attention is focused, linear, and appropriate.  NEURO: CN grossly intact. Oriented as arrived to appointment on time with no prompting. Moves both UE equally.  SKIN: No obvious lesions, wounds, erythema, or cyanosis noted on face or hands.  Assessment and Plan:   [name] was seen today for follow-up.   Diagnoses and all orders for this visit:  Morbid obesity with BMI of 45.0-49.9, adult ([place])  Other orders -     Semaglutide,0.25 or 0.5MG /DOS, (OZEMPIC, 0.25 OR 0.5 MG/DOSE,) 2 MG/1.5ML SOPN; Inject 0.25 mg into the skin once a week.   Doing well with weekly 0.25 mg Ozempic. Will continue this regimen x 3 months. Follow-up sooner if concerns. Plan for CPE at f/u.  [name] Reviewed expectations re: course of current medical issues. . Discussed self-management of symptoms. . Outlined signs and symptoms indicating need for more acute intervention. . Patient verbalized understanding and all questions were answered. [name] Health Maintenance issues including appropriate healthy diet, exercise, and smoking avoidance were discussed with patient. . See orders for this visit as documented in the electronic medical record.  I discussed the assessment and treatment plan with the patient. The patient was provided an opportunity to ask questions and all were answered. The patient agreed with the plan and demonstrated an understanding of the instructions.   The patient was advised to call back or seek an in-person evaluation if the symptoms worsen or if the condition fails to improve as anticipated.   CMA or LPN served as scribe during this visit. History, Physical, and Plan performed by medical provider. The above documentation has been reviewed and is accurate and complete.   [place], [place] 08/13/2019

## 2019-08-28 ENCOUNTER — Encounter: Payer: Self-pay | Admitting: Physician Assistant

## 2019-08-28 NOTE — Telephone Encounter (Signed)
Spoke to pt told her received information from insurance regarding PA for Ozempic it was denied due to she does not have Dx of Diabetes and no similar medications would be covered either. Told pt if she wants to try to use coupon she can but not sure if it will work. Pt verbalized understanding and asked if she could schedule an appt to discuss alternative Tx. Virtual appt scheduled for tomorrow at 1:20 PM with [facility]. Pt verbalized understanding.

## 2019-08-29 ENCOUNTER — Ambulatory Visit (INDEPENDENT_AMBULATORY_CARE_PROVIDER_SITE_OTHER): Payer: Managed Care, Other (non HMO) | Admitting: Physician Assistant

## 2019-08-29 ENCOUNTER — Encounter: Payer: Self-pay | Admitting: Physician Assistant

## 2019-08-29 VITALS — Ht 65.0 in | Wt 283.0 lb

## 2019-08-29 DIAGNOSIS — Z6841 Body Mass Index (BMI) 40.0 and over, adult: Secondary | ICD-10-CM

## 2019-08-29 MED ORDER — SAXENDA 18 MG/3ML ~~LOC~~ SOPN: PEN_INJECTOR | SUBCUTANEOUS | 3 refills | Status: AC

## 2019-08-29 NOTE — Progress Notes (Signed)
Virtual Visit via Video   I connected with Veronica Gray on 08/29/19 at  1:20 PM EST by a video enabled telemedicine application and verified that I am speaking with the correct person using two identifiers. Location patient: Home Location provider: [facility], Office Persons participating in the virtual visit: Veronica Gray, Veronica Coke PA-C, Anselmo Pickler, LPN   I discussed the limitations of evaluation and management by telemedicine and the availability of in person appointments. The patient expressed understanding and agreed to proceed.  I acted as a Education administrator for [employer], [employer], LPN  Subjective:   HPI:  Obesity Pt following up, due to medication Ozempic has been denied by insurance. Pt would like to discuss alternatives. She likes the way the Ozempic made her have early satiety and feels like another medication that is similar would be helpful.  Wt Readings from Last 5 Encounters:  08/29/19 283 lb (128.4 kg)  08/13/19 283 lb (128.4 kg)  07/13/19 296 lb 8 oz (134.5 kg)  07/10/19 280 lb (127 kg)  03/06/19 286 lb (129.7 kg)    ROS: See pertinent positives and negatives per HPI.  Patient Active Problem List   Diagnosis Date Noted  . Anemia   . IUD (intrauterine device) in place 06/13/2019  . Hypertriglyceridemia 06/13/2019  . Morbid obesity with BMI of 45.0-49.9, adult ([place]) 08/09/2018  . Lactose intolerance 08/09/2018  . Vitamin D deficiency 08/09/2018  . History of pituitary adenoma ([place]) 05/20/2017  . ADD (attention deficit disorder), Rx Adderall 05/20/2017  . Current smoker 02/27/2017  . Depression 02/25/2017  . Anxiety 02/25/2017    Social History   Tobacco Use  . Smoking status: Current Every Day Smoker    Packs/day: 0.50    Types: E-cigarettes  . Smokeless tobacco: Never Used  Substance Use Topics  . Alcohol use: Yes    Comment: occasionally    Current Outpatient Medications:  .  ABILIFY 2 MG tablet, Take 2 mg by mouth  daily., Disp: , Rfl:  .  ADDERALL 15 MG tablet, Take 1 tablet by mouth 2 (two) times daily., Disp: , Rfl:  .  levonorgestrel (MIRENA) 20 MCG/24HR IUD, 1 each by Intrauterine route once., Disp: , Rfl:  .  venlafaxine XR (EFFEXOR-XR) 150 MG 24 hr capsule, TAKE ONE CAPSULE BY MOUTH EVERY DAY WITH BREAKFAST, TAKE WITH 75 MG CAPSULE, Disp: 90 capsule, Rfl: 1 .  venlafaxine XR (EFFEXOR-XR) 75 MG 24 hr capsule, Take one daily WITH Effexor 150 mg., Disp: 90 capsule, Rfl: 1 .  Liraglutide -Weight Management (SAXENDA) 18 MG/3ML SOPN, Inject 0.6 mg into the skin daily for 7 days, THEN 1.2 mg daily for 21 days., Disp: 1 pen, Rfl: 3  Allergies  Allergen Reactions  . Wellbutrin [Bupropion]     Objective:   VITALS: Per patient if applicable, see vitals. GENERAL: Alert, appears well and in no acute distress. HEENT: Atraumatic, conjunctiva clear, no obvious abnormalities on inspection of external nose and ears. NECK: Normal movements of the head and neck. CARDIOPULMONARY: No increased WOB. Speaking in clear sentences. I:E ratio WNL.  MS: Moves all visible extremities without noticeable abnormality. PSYCH: Pleasant and cooperative, well-groomed. Speech normal rate and rhythm. Affect is appropriate. Insight and judgement are appropriate. Attention is focused, linear, and appropriate.  NEURO: CN grossly intact. Oriented as arrived to appointment on time with no prompting. Moves both UE equally.  SKIN: No obvious lesions, wounds, erythema, or cyanosis noted on face or hands.  Assessment and Plan:  Tarica was seen today for obesity.  Diagnoses and all orders for this visit:  Morbid obesity with BMI of 45.0-49.9, adult ([place])  Other orders -     Liraglutide -Weight Management (SAXENDA) 18 MG/3ML SOPN; Inject 0.6 mg into the skin daily for 7 days, THEN 1.2 mg daily for 21 days.   Will trial Saxenda and see if this is more compatible with her insurance. Follow-up in 1-2 months, sooner if  concerns.  . Reviewed expectations re: course of current medical issues. . Discussed self-management of symptoms. . Outlined signs and symptoms indicating need for more acute intervention. . Patient verbalized understanding and all questions were answered. [name] Health Maintenance issues including appropriate healthy diet, exercise, and smoking avoidance were discussed with patient. . See orders for this visit as documented in the electronic medical record.  I discussed the assessment and treatment plan with the patient. The patient was provided an opportunity to ask questions and all were answered. The patient agreed with the plan and demonstrated an understanding of the instructions.   The patient was advised to call back or seek an in-person evaluation if the symptoms worsen or if the condition fails to improve as anticipated.   CMA or LPN served as scribe during this visit. History, Physical, and Plan performed by medical provider. The above documentation has been reviewed and is accurate and complete.   [place], [place] 08/29/2019

## 2019-08-30 ENCOUNTER — Telehealth: Payer: Self-pay | Admitting: *Deleted

## 2019-08-30 NOTE — Telephone Encounter (Signed)
Called [facility] and spoke to Amy told her PA for Kirke Shaggy was approved. Amy verbalized understanding and ran medication and it went through. Asked her how much it would be for pt? Amy said $50.00. Told her okay thanks.

## 2019-08-30 NOTE — Telephone Encounter (Signed)
Spoke to pt told her I got Saxenda approved thru 12/28/2019 and it will be $50.00 pharmacy is aware. Pt verbalized understanding.

## 2019-08-30 NOTE — Telephone Encounter (Signed)
Received from pharmacy PA need to be done on Saxenda 18 mg/3 ml Pen-injectors. KEY: [id number].  Prior Authorization done:  Your request has been approved [id number];Review Type:Prior Auth;Coverage Start Date:08/24/2019;Coverage End Date:12/28/2019;

## 2019-10-03 ENCOUNTER — Telehealth: Payer: Self-pay | Admitting: Physician Assistant

## 2019-10-03 ENCOUNTER — Other Ambulatory Visit: Payer: Self-pay | Admitting: Physician Assistant

## 2019-10-03 MED ORDER — ABILIFY 2 MG PO TABS: 2.0000 mg | ORAL_TABLET | Freq: Every day | ORAL | 0 refills | Status: DC

## 2019-10-03 NOTE — Telephone Encounter (Signed)
I'm ok with filling until she finds new psychiatrist.  I have sent in refill for abilify.

## 2019-10-03 NOTE — Telephone Encounter (Signed)
Please see message from patient

## 2019-10-03 NOTE — Telephone Encounter (Signed)
Spoke to pt told her Aldona Bar said I'm ok with filling until she finds new psychiatrist. I have sent in refill for abilify. Pt verbalized understanding.

## 2019-10-03 NOTE — Telephone Encounter (Signed)
Pt called asking if Aldona Bar would prescribe her the medications she is normally prescribed over at her psychology office while she is looking for a new psychologist. Medications are: velnafaxine 75 MG, venlafaxine 150 MG, abilify 2 MG.   Pt still uses the [employer] [phone].   Pt is requesting refill on the abilify 2 MG.    MEDICATION: Abilify 2 MG  PHARMACY: [facility]   Comments: pt needs refill if Aldona Bar is able to   **Let patient know to contact pharmacy at the end of the day to make sure medication is ready. **  ** Please notify patient to allow 48-72 hours to process**  **Encourage patient to contact the pharmacy for refills or they can request refills through [facility]**

## 2019-10-04 ENCOUNTER — Telehealth: Payer: Self-pay | Admitting: *Deleted

## 2019-10-04 MED ORDER — ARIPIPRAZOLE 2 MG PO TABS: 2.0000 mg | ORAL_TABLET | Freq: Every day | ORAL | 0 refills | Status: DC

## 2019-10-04 NOTE — Telephone Encounter (Signed)
Spoke to pt, told her received fax from pharmacy saying Abilify Brand name is not covered by insurance. Is there a reason why you are taking Brand name? Pt said no she is taking generic. Told pt we just ordered what was there before and it was not generic. Are you okay to take alternative Aripiprazole generic for Abilify? Pt said yes. Told pt okay will go ahead and send in Rx to the pharmacy. Pt verbalized understanding. Rx sent to pharmacy.

## 2019-10-24 ENCOUNTER — Other Ambulatory Visit: Payer: Self-pay | Admitting: Physician Assistant

## 2019-10-24 DIAGNOSIS — F331 Major depressive disorder, recurrent, moderate: Secondary | ICD-10-CM

## 2019-11-13 ENCOUNTER — Encounter: Payer: Self-pay | Admitting: Physician Assistant

## 2019-11-13 ENCOUNTER — Ambulatory Visit (INDEPENDENT_AMBULATORY_CARE_PROVIDER_SITE_OTHER): Payer: Managed Care, Other (non HMO) | Admitting: Physician Assistant

## 2019-11-13 ENCOUNTER — Other Ambulatory Visit: Payer: Self-pay

## 2019-11-13 VITALS — BP 118/74 | HR 86 | Temp 97.2°F | Ht 65.0 in | Wt 290.0 lb

## 2019-11-13 DIAGNOSIS — F909 Attention-deficit hyperactivity disorder, unspecified type: Secondary | ICD-10-CM

## 2019-11-13 DIAGNOSIS — Z0000 Encounter for general adult medical examination without abnormal findings: Secondary | ICD-10-CM | POA: Diagnosis not present

## 2019-11-13 DIAGNOSIS — F419 Anxiety disorder, unspecified: Secondary | ICD-10-CM | POA: Diagnosis not present

## 2019-11-13 DIAGNOSIS — Z6841 Body Mass Index (BMI) 40.0 and over, adult: Secondary | ICD-10-CM

## 2019-11-13 DIAGNOSIS — F331 Major depressive disorder, recurrent, moderate: Secondary | ICD-10-CM

## 2019-11-13 NOTE — Progress Notes (Signed)
I acted as a Education administrator for [employer], PA-C Anselmo Pickler, LPN  Subjective:    Veronica Gray is a 24 y.o. female and is here for a comprehensive physical exam.  HPI  There are no preventive care reminders to display for this patient.  Acute Concerns: None  Chronic Issues: Obesity -- currently on Saxenda, and is not sure it is working.  She is not sure that the medication is helping to curb her appetite.  We had briefly done a trial on Ozempic but this medication was unable to be paid for through her insurance.  She felt that this medication is more effective.  She is about to purchase a new stationary bike. Depression/anxiety --she is hoping to start seeing a new therapist, needs a referral.  She is also looking for a new psychiatrist.  She is currently stable on her medication regimen  --she is on Abilify 2 mg daily, Effexor 150 mg daily, Adderall 15 mg twice daily.  Denies any SI/HI.  Health Maintenance: Immunizations -- UTD Colonoscopy -- N/A Mammogram -- N/A PAP -- UTD, due 05/2021 Bone Density -- N/A Diet -- variable intake, does have issues with snacking at times Sleep habits -- overall sleep is okay Exercise -- saving up for a stationary bike Current Weight -- Weight: 290 lb (131.5 kg)  Weight History: Wt Readings from Last 10 Encounters:  11/13/19 290 lb (131.5 kg)  08/29/19 283 lb (128.4 kg)  08/13/19 283 lb (128.4 kg)  07/13/19 296 lb 8 oz (134.5 kg)  07/10/19 280 lb (127 kg)  03/06/19 286 lb (129.7 kg)  11/27/18 286 lb (129.7 kg)  08/09/18 288 lb 3.2 oz (130.7 kg)  06/29/18 285 lb (129.3 kg)  05/30/18 283 lb (128.4 kg)   Mood --stable No LMP recorded. (Menstrual status: IUD). Period characteristics -- spotting Birth control -- IUD  Depression screen [facility] 2/9 11/13/2019  Decreased Interest 0  Down, Depressed, Hopeless 1  PHQ - 2 Score 1  Altered sleeping 0  Tired, decreased energy 1  Change in appetite 0  Feeling bad or failure about yourself  1    Trouble concentrating 0  Moving slowly or fidgety/restless 0  Suicidal thoughts 0  PHQ-9 Score 3  Difficult doing work/chores Not difficult at all     Other providers/specialists: Patient Care Team: Inda Coke, [place] as PCP - General (Physician Assistant) Salvadore Dom, MD as Consulting Physician (Obstetrics and Gynecology) Renato Shin, MD as Consulting Physician (Endocrinology)   PMHx, SurgHx, SocialHx, Medications, and Allergies were reviewed in the Visit Navigator and updated as appropriate.   Past Medical History:  Diagnosis Date  . Anemia   . Anxiety   . Current smoker   . Depression   . Vitamin D deficiency     History reviewed. No pertinent surgical history.   Family History  Problem Relation Age of Onset  . Hypertrophic cardiomyopathy Mother   . Hyperlipidemia Father   . Hypertension Father   . Mental illness Maternal Grandmother   . Stroke Maternal Grandfather   . Hyperlipidemia Paternal Grandmother   . Cancer Paternal Grandfather        stomach  . Other Neg Hx        galactorrhea    Social History   Tobacco Use  . Smoking status: Current Every Day Smoker    Packs/day: 0.50    Types: E-cigarettes  . Smokeless tobacco: Never Used  Substance Use Topics  . Alcohol use: Yes    Comment:  occasionally  . Drug use: No    Review of Systems:   Review of Systems  Constitutional: Negative for chills, fever, malaise/fatigue and weight loss.  HENT: Negative for hearing loss, sinus pain and sore throat.   Respiratory: Negative for cough and hemoptysis.   Cardiovascular: Negative for chest pain, palpitations, leg swelling and PND.  Gastrointestinal: Negative for abdominal pain, constipation, diarrhea, heartburn, nausea and vomiting.  Genitourinary: Negative for dysuria, frequency and urgency.  Musculoskeletal: Negative for back pain, myalgias and neck pain.  Skin: Negative for itching and rash.  Neurological: Negative for dizziness, tingling,  seizures and headaches.  Endo/Heme/Allergies: Negative for polydipsia.  Psychiatric/Behavioral: Negative for depression. The patient is not nervous/anxious.     Objective:   BP 118/74   Pulse 86   Temp (!) 97.2 F (36.2 C) (Temporal)   Ht 5\' 5"  (1.651 m)   Wt 290 lb (131.5 kg)   SpO2 98%   BMI 48.26 kg/m   General Appearance:    Alert, cooperative, no distress, appears stated age  Head:    Normocephalic, without obvious abnormality, atraumatic  Eyes:    PERRL, conjunctiva/corneas clear, EOM's intact, fundi    benign, both eyes  Ears:    Normal TM's and external ear canals, both ears  Nose:   Nares normal, septum midline, mucosa normal, no drainage    or sinus tenderness  Throat:   Lips, mucosa, and tongue normal; teeth and gums normal  Neck:   Supple, symmetrical, trachea midline, no adenopathy;    thyroid:  no enlargement/tenderness/nodules; no carotid   bruit or JVD  Back:     Symmetric, no curvature, ROM normal, no CVA tenderness  Lungs:     Clear to auscultation bilaterally, respirations unlabored  Chest Wall:    No tenderness or deformity   Heart:    Regular rate and rhythm, S1 and S2 normal, no murmur, rub   or gallop  Breast Exam:   Deferred  Abdomen:     Soft, non-tender, bowel sounds active all four quadrants,    no masses, no organomegaly  Genitalia:   Deferred  Rectal:   Deferred  Extremities:   Extremities normal, atraumatic, no cyanosis or edema  Pulses:   2+ and symmetric all extremities  Skin:   Skin color, texture, turgor normal, no rashes or lesions  Lymph nodes:   Cervical, supraclavicular, and axillary nodes normal  Neurologic:   CNII-XII intact, normal strength, sensation and reflexes    throughout     Assessment/Plan:   Veronica Gray was seen today for annual exam and medication problem.  Diagnoses and all orders for this visit:  Routine physical examination Today patient counseled on age appropriate routine health concerns for screening and  prevention, each reviewed and up to date or declined. Immunizations reviewed and up to date or declined. Labs ordered and reviewed. Risk factors for depression reviewed and negative. Hearing function and visual acuity are intact. ADLs screened and addressed as needed. Functional ability and level of safety reviewed and appropriate. Education, counseling and referrals performed based on assessed risks today. Patient provided with a copy of personalized plan for preventive services.  Morbid obesity with BMI of 45.0-49.9, adult [facility]) We are going to stop Saxenda as she does not feel like this is helpful for her.  We are going to consider maybe doing Ozempic in the fall if it does get approved for weight loss.  Encourage patient to use her exercise bike regularly and continue to work on  better food choices.  Anxiety; Attention deficit hyperactivity disorder (ADHD), unspecified ADHD type; Moderate episode of recurrent major depressive disorder (HCC) Overall well controlled.  We'll continue to refill her prescriptions until she sees psychiatry.  I have put in a referral for psychiatry and for therapy.  Patient verbalized understanding to plan. I discussed with patient that if they develop any SI, to tell someone immediately and seek medical attention.  We opted not to get labs today as she had full work-up in November.  Well Adult Exam: Labs ordered: No. Patient counseling was done. See below for items discussed. Discussed the patient's BMI. The BMI is not in the acceptable range; BMI management plan is completed Follow up in 6 months.  Patient Counseling:   [x]     Nutrition: Stressed importance of moderation in sodium/caffeine intake, saturated fat and cholesterol, caloric balance, sufficient intake of fresh fruits, vegetables, fiber, calcium, iron, and 1 mg of folate supplement per day (for females capable of pregnancy).   [x]      Stressed the importance of regular exercise.    [x]     Substance  Abuse: Discussed cessation/primary prevention of tobacco, alcohol, or other drug use; driving or other dangerous activities under the influence; availability of treatment for abuse.    [x]      Injury prevention: Discussed safety belts, safety helmets, smoke detector, smoking near bedding or upholstery.    [x]      Sexuality: Discussed sexually transmitted diseases, partner selection, use of condoms, avoidance of unintended pregnancy  and contraceptive alternatives.    [x]     Dental health: Discussed importance of regular tooth brushing, flossing, and dental visits.   [x]      Health maintenance and immunizations reviewed. Please refer to Health maintenance section.   CMA or LPN served as scribe during this visit. History, Physical, and Plan performed by medical provider. The above documentation has been reviewed and is accurate and complete.  Inda Coke, PA-C [place]

## 2019-11-13 NOTE — Patient Instructions (Signed)
It was great to see you!  1. Stop Saxenda 2. Start exercising when you get your bike! 3. Reach out to me in July about Ozempic 4. Work on reducing the e-cigs 5. I will put in referrals today for psych and talk therapy  Take care,  [facility] Maintenance, Female Adopting a healthy lifestyle and getting preventive care are important in promoting health and wellness. Ask your health care provider about:  The right schedule for you to have regular tests and exams.  Things you can do on your own to prevent diseases and keep yourself healthy. What should I know about diet, weight, and exercise? Eat a healthy diet   Eat a diet that includes plenty of vegetables, fruits, low-fat dairy products, and lean protein.  Do not eat a lot of foods that are high in solid fats, added sugars, or sodium. Maintain a healthy weight Body mass index (BMI) is used to identify weight problems. It estimates body fat based on height and weight. Your health care provider can help determine your BMI and help you achieve or maintain a healthy weight. Get regular exercise Get regular exercise. This is one of the most important things you can do for your health. Most adults should:  Exercise for at least 150 minutes each week. The exercise should increase your heart rate and make you sweat (moderate-intensity exercise).  Do strengthening exercises at least twice a week. This is in addition to the moderate-intensity exercise.  Spend less time sitting. Even light physical activity can be beneficial. Watch cholesterol and blood lipids Have your blood tested for lipids and cholesterol at 26 years of age, then have this test every 5 years. Have your cholesterol levels checked more often if:  Your lipid or cholesterol levels are high.  You are older than 26 years of age.  You are at high risk for heart disease. What should I know about cancer screening? Depending on your health history and family  history, you may need to have cancer screening at various ages. This may include screening for:  Breast cancer.  Cervical cancer.  Colorectal cancer.  Skin cancer.  Lung cancer. What should I know about heart disease, diabetes, and high blood pressure? Blood pressure and heart disease  High blood pressure causes heart disease and increases the risk of stroke. This is more likely to develop in people who have high blood pressure readings, are of African descent, or are overweight.  Have your blood pressure checked: ? Every 3-5 years if you are 52-71 years of age. ? Every year if you are 75 years old or older. Diabetes Have regular diabetes screenings. This checks your fasting blood sugar level. Have the screening done:  Once every three years after age 44 if you are at a normal weight and have a low risk for diabetes.  More often and at a younger age if you are overweight or have a high risk for diabetes. What should I know about preventing infection? Hepatitis B If you have a higher risk for hepatitis B, you should be screened for this virus. Talk with your health care provider to find out if you are at risk for hepatitis B infection. Hepatitis C Testing is recommended for:  Everyone born from [birth year] through 1967.  Anyone with known risk factors for hepatitis C. Sexually transmitted infections (STIs)  Get screened for STIs, including gonorrhea and chlamydia, if: ? You are sexually active and are younger than 26 years of age. ?  You are older than 26 years of age and your health care provider tells you that you are at risk for this type of infection. ? Your sexual activity has changed since you were last screened, and you are at increased risk for chlamydia or gonorrhea. Ask your health care provider if you are at risk.  Ask your health care provider about whether you are at high risk for HIV. Your health care provider may recommend a prescription medicine to help prevent HIV  infection. If you choose to take medicine to prevent HIV, you should first get tested for HIV. You should then be tested every 3 months for as long as you are taking the medicine. Pregnancy  If you are about to stop having your period (premenopausal) and you may become pregnant, seek counseling before you get pregnant.  Take 400 to 800 micrograms (mcg) of folic acid every day if you become pregnant.  Ask for birth control (contraception) if you want to prevent pregnancy. Osteoporosis and menopause Osteoporosis is a disease in which the bones lose minerals and strength with aging. This can result in bone fractures. If you are 66 years old or older, or if you are at risk for osteoporosis and fractures, ask your health care provider if you should:  Be screened for bone loss.  Take a calcium or vitamin D supplement to lower your risk of fractures.  Be given hormone replacement therapy (HRT) to treat symptoms of menopause. Follow these instructions at home: Lifestyle  Do not use any products that contain nicotine or tobacco, such as cigarettes, e-cigarettes, and chewing tobacco. If you need help quitting, ask your health care provider.  Do not use street drugs.  Do not share needles.  Ask your health care provider for help if you need support or information about quitting drugs. Alcohol use  Do not drink alcohol if: ? Your health care provider tells you not to drink. ? You are pregnant, may be pregnant, or are planning to become pregnant.  If you drink alcohol: ? Limit how much you use to 0-1 drink a day. ? Limit intake if you are breastfeeding.  Be aware of how much alcohol is in your drink. In the [place] one drink equals one 12 oz bottle of beer (355 mL), one 5 oz glass of wine (148 mL), or one 1 oz glass of hard liquor (44 mL). General instructions  Schedule regular health, dental, and eye exams.  Stay current with your vaccines.  Tell your health care provider if: ? You  often feel depressed. ? You have ever been abused or do not feel safe at home. Summary  Adopting a healthy lifestyle and getting preventive care are important in promoting health and wellness.  Follow your health care provider's instructions about healthy diet, exercising, and getting tested or screened for diseases.  Follow your health care provider's instructions on monitoring your cholesterol and blood pressure. This information is not intended to replace advice given to you by your health care provider. Make sure you discuss any questions you have with your health care provider. Document Revised: 08/02/2018 Document Reviewed: 08/02/2018 Elsevier Patient Education  2020 [employer].

## 2019-11-14 ENCOUNTER — Other Ambulatory Visit: Payer: Self-pay | Admitting: Family Medicine

## 2019-11-14 DIAGNOSIS — F331 Major depressive disorder, recurrent, moderate: Secondary | ICD-10-CM

## 2019-11-26 ENCOUNTER — Other Ambulatory Visit: Payer: Managed Care, Other (non HMO)

## 2019-11-28 ENCOUNTER — Other Ambulatory Visit: Payer: Managed Care, Other (non HMO)

## 2019-12-06 ENCOUNTER — Telehealth: Payer: Self-pay | Admitting: *Deleted

## 2019-12-06 NOTE — Telephone Encounter (Signed)
Pt called back, told her received fax from pharmacy to do PA for Saxenda. I have that you stopped it last visit. Pt said yes I am no longer on it, hit the refill button by mistake. Told her okay just wanted to make sure I did not need to to PA. Pt said no.

## 2019-12-06 NOTE — Telephone Encounter (Signed)
Left message on voicemail to call office.  

## 2019-12-11 ENCOUNTER — Telehealth: Payer: Self-pay

## 2019-12-11 NOTE — Telephone Encounter (Signed)
PA completed via CoverMyMeds (Key: [id number]) Saxenda 18MG /3ML pen-injectors

## 2019-12-24 ENCOUNTER — Other Ambulatory Visit: Payer: Managed Care, Other (non HMO)

## 2019-12-27 ENCOUNTER — Other Ambulatory Visit: Payer: Self-pay | Admitting: Physician Assistant

## 2019-12-27 DIAGNOSIS — F331 Major depressive disorder, recurrent, moderate: Secondary | ICD-10-CM

## 2019-12-31 ENCOUNTER — Ambulatory Visit: Payer: Managed Care, Other (non HMO) | Attending: Internal Medicine

## 2019-12-31 DIAGNOSIS — Z20822 Contact with and (suspected) exposure to covid-19: Secondary | ICD-10-CM

## 2020-01-01 LAB — NOVEL CORONAVIRUS, NAA: SARS-CoV-2, NAA: NOT DETECTED

## 2020-01-01 LAB — SARS-COV-2, NAA 2 DAY TAT

## 2020-01-03 ENCOUNTER — Ambulatory Visit (INDEPENDENT_AMBULATORY_CARE_PROVIDER_SITE_OTHER): Payer: 59 | Admitting: Psychology

## 2020-01-03 DIAGNOSIS — F330 Major depressive disorder, recurrent, mild: Secondary | ICD-10-CM | POA: Diagnosis not present

## 2020-01-10 ENCOUNTER — Ambulatory Visit: Payer: 59 | Admitting: Psychology

## 2020-01-17 ENCOUNTER — Ambulatory Visit (INDEPENDENT_AMBULATORY_CARE_PROVIDER_SITE_OTHER): Payer: 59 | Admitting: Psychology

## 2020-01-17 DIAGNOSIS — F330 Major depressive disorder, recurrent, mild: Secondary | ICD-10-CM | POA: Diagnosis not present

## 2020-01-22 ENCOUNTER — Other Ambulatory Visit: Payer: Managed Care, Other (non HMO)

## 2020-01-24 ENCOUNTER — Other Ambulatory Visit: Payer: Managed Care, Other (non HMO)

## 2020-01-28 ENCOUNTER — Ambulatory Visit: Payer: Managed Care, Other (non HMO) | Attending: Internal Medicine

## 2020-01-28 DIAGNOSIS — Z20822 Contact with and (suspected) exposure to covid-19: Secondary | ICD-10-CM

## 2020-01-29 LAB — NOVEL CORONAVIRUS, NAA: SARS-CoV-2, NAA: NOT DETECTED

## 2020-01-29 LAB — SARS-COV-2, NAA 2 DAY TAT

## 2020-02-06 ENCOUNTER — Ambulatory Visit: Payer: Managed Care, Other (non HMO) | Attending: Internal Medicine

## 2020-02-18 ENCOUNTER — Ambulatory Visit: Payer: Managed Care, Other (non HMO) | Attending: Internal Medicine

## 2020-02-18 DIAGNOSIS — Z20822 Contact with and (suspected) exposure to covid-19: Secondary | ICD-10-CM

## 2020-02-19 LAB — NOVEL CORONAVIRUS, NAA: SARS-CoV-2, NAA: NOT DETECTED

## 2020-02-19 LAB — SARS-COV-2, NAA 2 DAY TAT

## 2020-02-28 ENCOUNTER — Ambulatory Visit (INDEPENDENT_AMBULATORY_CARE_PROVIDER_SITE_OTHER): Payer: 59 | Admitting: Psychology

## 2020-02-28 ENCOUNTER — Other Ambulatory Visit: Payer: Self-pay | Admitting: Physician Assistant

## 2020-02-28 DIAGNOSIS — F330 Major depressive disorder, recurrent, mild: Secondary | ICD-10-CM | POA: Diagnosis not present

## 2020-02-28 DIAGNOSIS — F331 Major depressive disorder, recurrent, moderate: Secondary | ICD-10-CM

## 2020-03-19 ENCOUNTER — Ambulatory Visit: Payer: Managed Care, Other (non HMO) | Attending: Internal Medicine

## 2020-03-19 DIAGNOSIS — Z20822 Contact with and (suspected) exposure to covid-19: Secondary | ICD-10-CM

## 2020-03-20 ENCOUNTER — Ambulatory Visit (INDEPENDENT_AMBULATORY_CARE_PROVIDER_SITE_OTHER): Payer: 59 | Admitting: Psychology

## 2020-03-20 DIAGNOSIS — F330 Major depressive disorder, recurrent, mild: Secondary | ICD-10-CM

## 2020-03-20 LAB — NOVEL CORONAVIRUS, NAA: SARS-CoV-2, NAA: NOT DETECTED

## 2020-03-20 LAB — SARS-COV-2, NAA 2 DAY TAT

## 2020-03-24 ENCOUNTER — Encounter: Payer: Self-pay | Admitting: Physician Assistant

## 2020-03-24 ENCOUNTER — Ambulatory Visit: Payer: Managed Care, Other (non HMO) | Admitting: Physician Assistant

## 2020-03-24 ENCOUNTER — Other Ambulatory Visit: Payer: Self-pay

## 2020-03-24 VITALS — BP 120/86 | HR 100 | Temp 97.4°F | Ht 65.0 in | Wt 306.2 lb

## 2020-03-24 DIAGNOSIS — E669 Obesity, unspecified: Secondary | ICD-10-CM | POA: Diagnosis not present

## 2020-03-24 MED ORDER — WEGOVY 0.5 MG/0.5ML ~~LOC~~ SOAJ: 0.5000 mg | SUBCUTANEOUS | 0 refills | Status: DC

## 2020-03-24 NOTE — Progress Notes (Signed)
Veronica Gray is a 24 y.o. female is here for follow up.  I acted as a Education administrator for [employer], PA-C Veronica Pickler, LPN   History of Present Illness:   Chief Complaint  Patient presents with   Obesity    HPI   Obesity Pt following up today, pt stopped Saxenda after last visit. She did not find that it was helping her weight loss.  She briefly trialed Ozempic at a previous visit and tolerated well but her insurance would not pay for this.  She continues to work on diet and exercise.  She is interested in started [facility].  Health Maintenance Due  Topic Date Due   Hepatitis C Screening  Never done   COVID-19 Vaccine (1) Never done   INFLUENZA VACCINE  03/23/2020    Past Medical History:  Diagnosis Date   Anemia    Anxiety    Current smoker    Depression    Vitamin D deficiency      Social History   Tobacco Use   Smoking status: Current Every Day Smoker    Packs/day: 0.50    Types: E-cigarettes   Smokeless tobacco: Never Used  Vaping Use   Vaping Use: Every day  Substance Use Topics   Alcohol use: Yes    Comment: occasionally   Drug use: No    History reviewed. No pertinent surgical history.  Family History  Problem Relation Age of Onset   Hypertrophic cardiomyopathy Mother    Hyperlipidemia Father    Hypertension Father    Mental illness Maternal Grandmother    Stroke Maternal Grandfather    Hyperlipidemia Paternal Grandmother    Cancer Paternal Grandfather        stomach   Other Neg Hx        galactorrhea    PMHx, SurgHx, SocialHx, FamHx, Medications, and Allergies were reviewed in the Visit Navigator and updated as appropriate.   Patient Active Problem List   Diagnosis Date Noted   Anemia    IUD (intrauterine device) in place 06/13/2019   Hypertriglyceridemia 06/13/2019   Morbid obesity with BMI of 45.0-49.9, adult ([place]) 08/09/2018   Lactose intolerance 08/09/2018   Vitamin D deficiency 08/09/2018    History of pituitary adenoma ([place]) 05/20/2017   ADD (attention deficit disorder), Rx Adderall 05/20/2017   Current smoker 02/27/2017   Depression 02/25/2017   Anxiety 02/25/2017    Social History   Tobacco Use   Smoking status: Current Every Day Smoker    Packs/day: 0.50    Types: E-cigarettes   Smokeless tobacco: Never Used  Vaping Use   Vaping Use: Every day  Substance Use Topics   Alcohol use: Yes    Comment: occasionally   Drug use: No    Current Medications and Allergies:    Current Outpatient Medications:    ADDERALL 15 MG tablet, Take 1 tablet by mouth 2 (two) times daily., Disp: , Rfl:    ARIPiprazole (ABILIFY) 2 MG tablet, TAKE 1 TABLET(2 MG) BY MOUTH DAILY, Disp: 90 tablet, Rfl: 0   levonorgestrel (MIRENA) 20 MCG/24HR IUD, 1 each by Intrauterine route once., Disp: , Rfl:    venlafaxine XR (EFFEXOR-XR) 150 MG 24 hr capsule, TAKE 1 CAPSULE BY MOUTH EVERY DAY AS DIRECTED, Disp: 30 capsule, Rfl: 1   Semaglutide-Weight Management (WEGOVY) 0.5 MG/0.5ML SOAJ, Inject 0.5 mg into the skin once a week., Disp: 2 mL, Rfl: 0   Allergies  Allergen Reactions   Wellbutrin [Bupropion]     Review  of Systems   ROS  Negative unless otherwise specified per HPI.  Vitals:   Vitals:   03/24/20 1529  BP: 120/86  Pulse: 100  Temp: (!) 97.4 F (36.3 C)  TempSrc: Temporal  SpO2: 96%  Weight: (!) 306 lb 4 oz (138.9 kg)  Height: 5\' 5"  (1.651 m)     Body mass index is 50.96 kg/m.   Physical Exam:    Physical Exam Constitutional:      Appearance: She is well-developed.  HENT:     Head: Normocephalic and atraumatic.  Eyes:     Conjunctiva/sclera: Conjunctivae normal.  Pulmonary:     Effort: Pulmonary effort is normal.  Musculoskeletal:        General: Normal range of motion.     Cervical back: Normal range of motion and neck supple.  Skin:    General: Skin is warm and dry.  Neurological:     Mental Status: She is alert and oriented to person,  place, and time.  Psychiatric:        Behavior: Behavior normal.        Thought Content: Thought content normal.        Judgment: Judgment normal.      Assessment and Plan:    Veronica Gray was seen today for obesity.  Diagnoses and all orders for this visit:  Obesity, unspecified classification, unspecified obesity type, unspecified whether serious comorbidity present Start Wegovy 0.25 mg weekly x 4 weeks -- sample provided. Will order next dose increase, follow-up in 2 months, sooner if concerns.   Other orders -     Semaglutide-Weight Management (WEGOVY) 0.5 MG/0.5ML SOAJ; Inject 0.5 mg into the skin once a week.     Reviewed expectations re: course of current medical issues.  Discussed self-management of symptoms.  Outlined signs and symptoms indicating need for more acute intervention.  Patient verbalized understanding and all questions were answered.  See orders for this visit as documented in the electronic medical record.  Patient received an After Visit Summary.  CMA or LPN served as scribe during this visit. History, Physical, and Plan performed by medical provider. The above documentation has been reviewed and is accurate and complete.   Inda Coke, PA-C [place] [place] 03/24/2020  Follow-up: No follow-ups on file.

## 2020-03-24 NOTE — Patient Instructions (Addendum)
Start [employer] sample. Hopefully we will have the kinks worked out by the time you need to pick up the next dose!  I have sent the medication in, but I have NOT heard from the drug rep yet -- let me reach out to her before you go to pick up the script so we can figure out the next best steps.  Dosing schedule: Week 1 through week 4 : 0.25 mg once weekly. Week 5 through week 8: 0.5 mg once weekly. Week 9 through week 12: 1 mg once weekly. Week 13 through week 16: 1.7 mg once weekly. Week 17 and thereafter (maintenance dosage): 2.4 mg once weekly; if not tolerated, may temporarily decrease dosage to 1.7 mg once weekly for up to 4 additional weeks, then increase to 2.4 mg once weekly.  Follow-up in 2 months, sooner if concerns.

## 2020-03-26 ENCOUNTER — Other Ambulatory Visit: Payer: Self-pay | Admitting: Physician Assistant

## 2020-03-26 MED ORDER — WEGOVY 0.5 MG/0.5ML ~~LOC~~ SOAJ: 0.5000 mg | SUBCUTANEOUS | 0 refills | Status: DC

## 2020-04-03 ENCOUNTER — Other Ambulatory Visit: Payer: Self-pay | Admitting: Physician Assistant

## 2020-04-03 DIAGNOSIS — F331 Major depressive disorder, recurrent, moderate: Secondary | ICD-10-CM

## 2020-04-05 ENCOUNTER — Other Ambulatory Visit: Payer: Self-pay | Admitting: Physician Assistant

## 2020-04-07 ENCOUNTER — Telehealth: Payer: Self-pay | Admitting: *Deleted

## 2020-04-07 NOTE — Telephone Encounter (Signed)
Received a fax from [facility] for PA needed for [facility] 0.5mg /0.5 ml Auto- injectors. PA done through Covermymeds. KEY: [id number];FOYDXA:JOINOMVE;Review Type:Prior Auth;Coverage Start Date:03/08/2020;Coverage End Date:11/03/2020;

## 2020-04-08 NOTE — Telephone Encounter (Signed)
Called [facility] and spoke to Tammy told her PA for [facility] was approved. Tammy verbalized understanding.    Left detailed message on personal voicemail that Prior Auth was done an approved for [facility]. Pharmacy will get ready when comes in today. Any questions please call office.

## 2020-05-19 ENCOUNTER — Encounter: Payer: Self-pay | Admitting: Physician Assistant

## 2020-05-19 ENCOUNTER — Telehealth (INDEPENDENT_AMBULATORY_CARE_PROVIDER_SITE_OTHER): Payer: Managed Care, Other (non HMO) | Admitting: Physician Assistant

## 2020-05-19 ENCOUNTER — Other Ambulatory Visit: Payer: Self-pay

## 2020-05-19 VITALS — Ht 65.0 in | Wt 285.0 lb

## 2020-05-19 DIAGNOSIS — H9203 Otalgia, bilateral: Secondary | ICD-10-CM | POA: Diagnosis not present

## 2020-05-19 DIAGNOSIS — U071 COVID-19: Secondary | ICD-10-CM

## 2020-05-19 NOTE — Progress Notes (Signed)
Virtual Visit via Video   I connected with Veronica Gray on 05/19/20 at 12:30 PM EDT by a video enabled telemedicine application and verified that I am speaking with the correct person using two identifiers. Location patient: Home Location provider: [facility], Office Persons participating in the virtual visit: Nohealani Medinger, Inda Coke PA-C, Anselmo Pickler, LPN   I discussed the limitations of evaluation and management by telemedicine and the availability of in person appointments. The patient expressed understanding and agreed to proceed.  I acted as a Education administrator for [employer], PA-C [employer], LPN   Subjective:   HPI:   Patient is requesting evaluation for possible COVID-19.  Symptom onset: Monday 9/20  Travel/contacts:  contracted  Vaccination status: Yes  Patient endorses the following symptoms: sinus congestion, rhinorrhea, ear pain, productive cough (light yellow) and myalgia  Patient denies the following symptoms: subjective fever, sinus headache, sore throat, wheezing, shortness of breath, chest tightness and chest pain  Treatments tried: Advil decongestant, Ofloxacin 0.3% ear drops. Pt was seen at Urgent Care on Saturday and was told her are closed. Pt says she has used the ear drops only 3 times. Overall her the initial ear that was causing her symptoms has improved.  Ear pain is currently 2/10  Patient risk factors: Current [id number]-41 risk of complications score: 1 Smoking status: Veronica Gray  reports that she has been smoking e-cigarettes. She has been smoking about 0.50 packs per day. She has never used smokeless tobacco. If female, currently pregnant? []   Yes [x]   No  ROS: See pertinent positives and negatives per HPI.  Patient Active Problem List   Diagnosis Date Noted  . Anemia   . IUD (intrauterine device) in place 06/13/2019  . Hypertriglyceridemia 06/13/2019  . Morbid obesity with BMI of 45.0-49.9, adult ([place]) 08/09/2018  .  Lactose intolerance 08/09/2018  . Vitamin D deficiency 08/09/2018  . History of pituitary adenoma ([place]) 05/20/2017  . ADD (attention deficit disorder), Rx Adderall 05/20/2017  . Current smoker 02/27/2017  . Depression 02/25/2017  . Anxiety 02/25/2017    Social History   Tobacco Use  . Smoking status: Current Every Day Smoker    Packs/day: 0.50    Types: E-cigarettes  . Smokeless tobacco: Never Used  Substance Use Topics  . Alcohol use: Yes    Comment: occasionally    Current Outpatient Medications:  .  ADDERALL 15 MG tablet, Take 1 tablet by mouth 2 (two) times daily., Disp: , Rfl:  .  ARIPiprazole (ABILIFY) 2 MG tablet, TAKE 1 TABLET(2 MG) BY MOUTH DAILY, Disp: 90 tablet, Rfl: 0 .  levonorgestrel (MIRENA) 20 MCG/24HR IUD, 1 each by Intrauterine route once., Disp: , Rfl:  .  ofloxacin (FLOXIN) 0.3 % OTIC solution, Place in ear(s)., Disp: , Rfl:  .  venlafaxine XR (EFFEXOR-XR) 150 MG 24 hr capsule, TAKE 1 CAPSULE BY MOUTH EVERY DAY AS DIRECTED, Disp: 30 capsule, Rfl: 1  Allergies  Allergen Reactions  . Wellbutrin [Bupropion]     Objective:   VITALS: Per patient if applicable, see vitals. GENERAL: Alert, appears well and in no acute distress. HEENT: Atraumatic, conjunctiva clear, no obvious abnormalities on inspection of external nose and ears. NECK: Normal movements of the head and neck. CARDIOPULMONARY: No increased WOB. Speaking in clear sentences. I:E ratio WNL.  MS: Moves all visible extremities without noticeable abnormality. PSYCH: Pleasant and cooperative, well-groomed. Speech normal rate and rhythm. Affect is appropriate. Insight and judgement are appropriate. Attention is focused, linear, and appropriate.  NEURO: CN grossly intact. Oriented as arrived to appointment on time with no prompting. Moves both UE equally.  SKIN: No obvious lesions, wounds, erythema, or cyanosis noted on face or hands.  Assessment and Plan:   [name] was seen today for covid  positive.  Diagnoses and all orders for this visit:  COVID-19  Otalgia of both ears   No red flags on discussion, patient is not in any obvious distress during our visit. Discussed progression of most viral illness, and recommended supportive care at this point in time.   I recommended that she continue the NSAIDs and ofloxacin ear drops since one ear has responded well to this.  I have asked her to message me in a day or two if symptoms do not improve or if they worsen -- we may trial oral prednisone or oral abx depending on symptoms. She was in agreement to this.  Discussed over the counter supportive care options, with recommendations to push fluids and rest. Reviewed return precautions including new/worsening fever, SOB, new/worsening cough or other concerns.  Recommended need to self-quarantine and practice social distancing until symptoms resolve.   I discussed the assessment and treatment plan with the patient. The patient was provided an opportunity to ask questions and all were answered. The patient agreed with the plan and demonstrated an understanding of the instructions.   The patient was advised to call back or seek an in-person evaluation if the symptoms worsen or if the condition fails to improve as anticipated.   CMA or LPN served as scribe during this visit. History, Physical, and Plan performed by medical provider. The above documentation has been reviewed and is accurate and complete.   [place], [place] 05/19/2020

## 2020-05-20 ENCOUNTER — Other Ambulatory Visit: Payer: Self-pay | Admitting: Physician Assistant

## 2020-05-20 MED ORDER — AMOXICILLIN 875 MG PO TABS: 875.0000 mg | ORAL_TABLET | Freq: Two times a day (BID) | ORAL | 0 refills | Status: DC

## 2020-05-20 MED ORDER — PREDNISONE 20 MG PO TABS: 40.0000 mg | ORAL_TABLET | Freq: Every day | ORAL | 0 refills | Status: DC

## 2020-06-05 ENCOUNTER — Telehealth: Payer: Self-pay

## 2020-06-05 NOTE — Telephone Encounter (Signed)
. °  LAST APPOINTMENT DATE: 04/07/2020   NEXT APPOINTMENT DATE:@Visit  date not found  MEDICATION:ADDERALL 15 MG tablet    PHARMACY:[id number] - [place], [place] - [address]

## 2020-06-06 NOTE — Telephone Encounter (Signed)
Pt is requesting Adderall 15 mg tab LOV: 05/19/2020 No future visits scheduled Last refill: 08/07/2019  Approve?

## 2020-06-06 NOTE — Telephone Encounter (Signed)
I've never prescribed this for patient. Does she have another prescriber for this?

## 2020-06-10 NOTE — Telephone Encounter (Signed)
Spoke to pt told her Aldona Bar said she has never prescribed this for you. Who prescribed before? Pt said Dr. Juleen China. Told pt she can schedule an appt to discuss with [facility]. Pt verbalized understanding. Appt scheduled for 06/16/2020 virtual visit.

## 2020-06-16 ENCOUNTER — Other Ambulatory Visit: Payer: Self-pay

## 2020-06-16 ENCOUNTER — Encounter: Payer: Self-pay | Admitting: Physician Assistant

## 2020-06-16 ENCOUNTER — Telehealth (INDEPENDENT_AMBULATORY_CARE_PROVIDER_SITE_OTHER): Payer: Managed Care, Other (non HMO) | Admitting: Physician Assistant

## 2020-06-16 VITALS — Ht 65.0 in | Wt 300.0 lb

## 2020-06-16 DIAGNOSIS — F909 Attention-deficit hyperactivity disorder, unspecified type: Secondary | ICD-10-CM

## 2020-06-16 MED ORDER — AMPHETAMINE-DEXTROAMPHETAMINE 15 MG PO TABS: 15.0000 mg | ORAL_TABLET | Freq: Two times a day (BID) | ORAL | 0 refills | Status: DC

## 2020-06-16 NOTE — Progress Notes (Signed)
Virtual Visit via Video   I connected with Veronica Gray on 06/16/20 at  7:30 AM EDT by a video enabled telemedicine application and verified that I am speaking with the correct person using two identifiers. Location patient: Home Location provider: [facility], Office Persons participating in the virtual visit: Veronica Gray, Veronica Coke PA-C, Anselmo Pickler, LPN   I discussed the limitations of evaluation and management by telemedicine and the availability of in person appointments. The patient expressed understanding and agreed to proceed.  I acted as a Education administrator for [employer], [employer], LPN   Subjective:   HPI:   ADD Pt wants to discuss going back on Adderall 15 mg BID. Pt said her busy season is coming up and needs it for work. She was diagnosed in college, in 2014-2015 time frame. She states that she was up to Adderall 20 mg BID but eventually found that Adderall 15 mg BID was a better regimen for her. She doesn't take this year round, only takes when she really needs it during her busy season which is Oct-Mar.  Denies any concerns for chest pain, palpitations, insomnia.  ROS: See pertinent positives and negatives per HPI.  Patient Active Problem List   Diagnosis Date Noted   Anemia    IUD (intrauterine device) in place 06/13/2019   Hypertriglyceridemia 06/13/2019   Morbid obesity with BMI of 45.0-49.9, adult ([place]) 08/09/2018   Lactose intolerance 08/09/2018   Vitamin D deficiency 08/09/2018   History of pituitary adenoma ([place]) 05/20/2017   ADD (attention deficit disorder), Rx Adderall 05/20/2017   Current smoker 02/27/2017   Depression 02/25/2017   Anxiety 02/25/2017    Social History   Tobacco Use   Smoking status: Current Every Day Smoker    Packs/day: 0.50    Types: E-cigarettes   Smokeless tobacco: Never Used  Substance Use Topics   Alcohol use: Yes    Comment: occasionally    Current Outpatient Medications:     ARIPiprazole (ABILIFY) 2 MG tablet, TAKE 1 TABLET(2 MG) BY MOUTH DAILY, Disp: 90 tablet, Rfl: 0   levonorgestrel (MIRENA) 20 MCG/24HR IUD, 1 each by Intrauterine route once., Disp: , Rfl:    venlafaxine XR (EFFEXOR-XR) 150 MG 24 hr capsule, TAKE 1 CAPSULE BY MOUTH EVERY DAY AS DIRECTED, Disp: 30 capsule, Rfl: 1   amphetamine-dextroamphetamine (ADDERALL) 15 MG tablet, Take 1 tablet by mouth 2 (two) times daily., Disp: 60 tablet, Rfl: 0   [START ON 07/16/2020] amphetamine-dextroamphetamine (ADDERALL) 15 MG tablet, Take 1 tablet by mouth 2 (two) times daily., Disp: 60 tablet, Rfl: 0   [START ON 08/15/2020] amphetamine-dextroamphetamine (ADDERALL) 15 MG tablet, Take 1 tablet by mouth 2 (two) times daily., Disp: 60 tablet, Rfl: 0  Allergies  Allergen Reactions   Wellbutrin [Bupropion]     Objective:   VITALS: Per patient if applicable, see vitals. GENERAL: Alert, appears well and in no acute distress. HEENT: Atraumatic, conjunctiva clear, no obvious abnormalities on inspection of external nose and ears. NECK: Normal movements of the head and neck. CARDIOPULMONARY: No increased WOB. Speaking in clear sentences. I:E ratio WNL.  MS: Moves all visible extremities without noticeable abnormality. PSYCH: Pleasant and cooperative, well-groomed. Speech normal rate and rhythm. Affect is appropriate. Insight and judgement are appropriate. Attention is focused, linear, and appropriate.  NEURO: CN grossly intact. Oriented as arrived to appointment on time with no prompting. Moves both UE equally.  SKIN: No obvious lesions, wounds, erythema, or cyanosis noted on face or hands.  Assessment  and Plan:   Jasemine was seen today for attention deficit discorder.  Diagnoses and all orders for this visit:  Attention deficit hyperactivity disorder (ADHD), unspecified ADHD type Uncontrolled. Agreeable with re-starting medication. PDMP reviewed - no red flags. Refill Adderall x 6 months (asked patient to  send [place] a mychart message for refill after 3 months.)  Other orders -     amphetamine-dextroamphetamine (ADDERALL) 15 MG tablet; Take 1 tablet by mouth 2 (two) times daily. -     amphetamine-dextroamphetamine (ADDERALL) 15 MG tablet; Take 1 tablet by mouth 2 (two) times daily. -     amphetamine-dextroamphetamine (ADDERALL) 15 MG tablet; Take 1 tablet by mouth 2 (two) times daily.    I discussed the assessment and treatment plan with the patient. The patient was provided an opportunity to ask questions and all were answered. The patient agreed with the plan and demonstrated an understanding of the instructions.   The patient was advised to call back or seek an in-person evaluation if the symptoms worsen or if the condition fails to improve as anticipated.   CMA or LPN served as scribe during this visit. History, Physical, and Plan performed by medical provider. The above documentation has been reviewed and is accurate and complete.   Veronica Gray, [place] 06/16/2020'

## 2020-06-26 ENCOUNTER — Other Ambulatory Visit: Payer: Self-pay | Admitting: Physician Assistant

## 2020-06-26 DIAGNOSIS — F331 Major depressive disorder, recurrent, moderate: Secondary | ICD-10-CM

## 2020-07-09 ENCOUNTER — Other Ambulatory Visit: Payer: Self-pay | Admitting: Physician Assistant

## 2020-07-09 MED ORDER — ARIPIPRAZOLE 2 MG PO TABS: 2.0000 mg | ORAL_TABLET | Freq: Every day | ORAL | 0 refills | Status: DC

## 2020-08-20 ENCOUNTER — Other Ambulatory Visit: Payer: Self-pay

## 2020-08-20 ENCOUNTER — Ambulatory Visit: Payer: Managed Care, Other (non HMO) | Admitting: Allergy

## 2020-08-20 ENCOUNTER — Encounter: Payer: Self-pay | Admitting: Allergy

## 2020-08-20 VITALS — BP 130/88 | HR 97 | Temp 97.2°F | Resp 18 | Ht 65.0 in | Wt 313.8 lb

## 2020-08-20 DIAGNOSIS — T7840XD Allergy, unspecified, subsequent encounter: Secondary | ICD-10-CM | POA: Diagnosis not present

## 2020-08-20 DIAGNOSIS — H1013 Acute atopic conjunctivitis, bilateral: Secondary | ICD-10-CM

## 2020-08-20 DIAGNOSIS — T783XXD Angioneurotic edema, subsequent encounter: Secondary | ICD-10-CM

## 2020-08-20 DIAGNOSIS — L509 Urticaria, unspecified: Secondary | ICD-10-CM

## 2020-08-20 DIAGNOSIS — J3089 Other allergic rhinitis: Secondary | ICD-10-CM

## 2020-08-20 NOTE — Patient Instructions (Addendum)
Allergic reaction  Urticaria (hives) Angioedema (swelling)   -At this time etiology of hives and swelling is unknown however may have been triggered by dust mite and/or mold exposure.  Hives can be caused by a variety of different triggers including illness/infection, foods, medications, allergens, stings, exercise, pressure, vibrations, extremes of temperature to name a few however majority of the time there is no identifiable trigger.  -Environmental allergy skin testing is positive to grass pollen, weed pollen, molds, dust mites, cat, dog, horse.  -If hives or swelling return recommend use of H1/H2 blockers: Zyrtec 10 mg and Pepcid 20 mg taken twice a day.  Medications can be weaned down once hive free for a week or two -Should significant symptoms recur or new symptoms occur, a journal is to be kept recording any foods eaten, beverages consumed, medications taken, activities performed, and environmental conditions within a 6 hour time period prior to the onset of symptoms. For any symptoms concerning for anaphylaxis, epinephrine is to be administered and 911 is to be called immediately.  -Follow emergency action plan in case of reaction (provided today)  Follow-up in 6 months or sooner if needed

## 2020-08-20 NOTE — Progress Notes (Signed)
New Patient Note  RE: Veronica Gray MRN: [id number] DOB: 1996-08-21 Date of Office Visit: 08/20/2020  Referring provider: Jarold Motto, PA Primary care provider: Jarold Motto, [place]  Chief Complaint: hives  History of present illness: Veronica Gray is a 24 y.o. female presenting today for consultation for urticaria.    She states she woke up on 06/30/20 with hives all over her body.  She also reports having swelling of eyelid and upper lip. She went to UC due to this.  She has not identified any triggers at the time for this however she is concerned that mold in the trigger.  She has a vent in er closet that she believes may not have been correctly installed and she had taken out winter clothes that had been in the closet and wore them.  The hives started shortly after.  She states she did have a friend come over to clean out the closet thoroughly.  Denies any preceding illnesses.  No joint aches/pains. Hives did not leave any marks or bruising once resolved.  No change in soaps/lotions/detergent.  No stings.  While in the urgent care she states she felt like she was going to pass out and asked to lay down.  She states it took her blood pressure and noted to be low.  She received epinephrine x2, Benadryl and Decadron.  She was then transferred to the ED for further care where she received Pepcid.  ED notes a  blood pressure 131/71 and normal vital signs and a normal exam.  Her symptoms did resolve and she was monitored for several hours in the ED.  She was instructed to take Benadryl every 6 hours for the next 2 days as needed for hives or itching as well as Pepcid twice a day for 3 days.  She was also recommended to take steroids as well.  She reports developing small hives with contact exposure to short hair dogs as well as itchy eyes, runny nose.  She states the hives typically go away within minutes.  If she has having nasal ocular symptoms then she will usually take an antihistamine  which helps.  Review of systems within the past 4 weeks: Review of Systems  Constitutional: Negative.   HENT: Negative.   Eyes: Negative.   Respiratory: Negative.   Cardiovascular: Negative.   Gastrointestinal: Negative.   Musculoskeletal: Negative.   Skin: Negative.   Neurological: Negative.     All other systems negative unless noted above in HPI  Past medical history: Past Medical History:  Diagnosis Date   Anemia    Anxiety    Current smoker    Depression    Urticaria    Vitamin D deficiency     Past surgical history: No past surgical history on file.  Family history:  Family History  Problem Relation Age of Onset   Hypertrophic cardiomyopathy Mother    Hyperlipidemia Father    Hypertension Father    Mental illness Maternal Grandmother    Stroke Maternal Grandfather    Hyperlipidemia Paternal Grandmother    Cancer Paternal Grandfather        stomach   Other Neg Hx        galactorrhea    Social history: Lives in a home with carpeting in the bedroom with electric heating and central cooling.  3 cats and 3 dogs in the home.  There is no concern for water damage or roaches in the home.  She is a Medical laboratory scientific officer.  She does smoke/vape since 2013.  Medication List: Current Outpatient Medications  Medication Sig Dispense Refill   amphetamine-dextroamphetamine (ADDERALL) 15 MG tablet Take 1 tablet by mouth 2 (two) times daily. 60 tablet 0   ARIPiprazole (ABILIFY) 2 MG tablet Take 1 tablet (2 mg total) by mouth daily. 90 tablet 0   levonorgestrel (MIRENA) 20 MCG/24HR IUD 1 each by Intrauterine route once.     venlafaxine XR (EFFEXOR-XR) 150 MG 24 hr capsule TAKE 1 CAPSULE BY MOUTH EVERY DAY AS DIRECTED 30 capsule 2   amphetamine-dextroamphetamine (ADDERALL) 15 MG tablet Take 1 tablet by mouth 2 (two) times daily. 60 tablet 0   amphetamine-dextroamphetamine (ADDERALL) 15 MG tablet Take 1 tablet by mouth 2 (two) times daily.  60 tablet 0   No current facility-administered medications for this visit.    Known medication allergies: Allergies  Allergen Reactions   Wellbutrin [Bupropion]     uncontrollable shakes and insomnia      Physical examination: Pulse 97, temperature (!) 97.2 F (36.2 C), resp. rate 18, height 5\' 5"  (1.651 m), weight (!) 313 lb 12.8 oz (142.3 kg).  General: Alert, interactive, in no acute distress. HEENT: PERRLA, TMs pearly gray, turbinates non-edematous without discharge, post-pharynx non erythematous. Neck: Supple without lymphadenopathy. Lungs: Clear to auscultation without wheezing, rhonchi or rales. {no increased work of breathing. CV: Normal S1, S2 without murmurs. Abdomen: Nondistended, nontender. Skin: Warm and dry, without lesions or rashes. Extremities:  No clubbing, cyanosis or edema. Neuro:   Grossly intact.  Diagnositics/Labs:  Allergy testing: Environmental allergy skin prick testing is positive to blue, burweed marshelder, both dust mites, cat, dog, horse.. Intradermal testing is positive to mold mix 2 and 4. Allergy testing results were read and interpreted by provider, documented by clinical staff.   Assessment and plan:   Allergic reaction  Urticaria (hives) Angioedema (swelling)   -At this time etiology of hives and swelling is unknown however may have been triggered by dust mite and/or mold exposure.  Hives can be caused by a variety of different triggers including illness/infection, foods, medications, allergens, stings, exercise, pressure, vibrations, extremes of temperature to name a few however majority of the time there is no identifiable trigger.  -Environmental allergy skin testing is positive to grass pollen, weed pollen, molds, dust mites, cat, dog, horse.  -If hives or swelling return recommend use of H1/H2 blockers: Zyrtec 10 mg and Pepcid 20 mg taken twice a day.  Medications can be weaned down once hive free for a week or two -Should  significant symptoms recur or new symptoms occur, a journal is to be kept recording any foods eaten, beverages consumed, medications taken, activities performed, and environmental conditions within a 6 hour time period prior to the onset of symptoms. For any symptoms concerning for anaphylaxis, epinephrine is to be administered and 911 is to be called immediately.  -Follow emergency action plan in case of reaction (provided today)  Rhinitis with conjunctivitis -Allergy testing as above -Continue as needed use of Zyrtec for allergy symptom control  Follow-up in 6 months or sooner if needed  I appreciate the opportunity to take part in Charnese's care. Please do not hesitate to contact me with questions.  Sincerely,   [place], MD Allergy/Immunology [facility]

## 2020-09-01 ENCOUNTER — Other Ambulatory Visit: Payer: Self-pay | Admitting: Physician Assistant

## 2020-09-20 ENCOUNTER — Other Ambulatory Visit: Payer: Self-pay | Admitting: Physician Assistant

## 2020-09-20 DIAGNOSIS — F331 Major depressive disorder, recurrent, moderate: Secondary | ICD-10-CM

## 2020-10-04 ENCOUNTER — Other Ambulatory Visit: Payer: Self-pay | Admitting: Physician Assistant

## 2020-10-21 ENCOUNTER — Ambulatory Visit: Payer: Managed Care, Other (non HMO) | Admitting: Physician Assistant

## 2020-10-21 ENCOUNTER — Other Ambulatory Visit: Payer: Self-pay

## 2020-10-21 ENCOUNTER — Ambulatory Visit (INDEPENDENT_AMBULATORY_CARE_PROVIDER_SITE_OTHER): Payer: Managed Care, Other (non HMO)

## 2020-10-21 ENCOUNTER — Encounter: Payer: Self-pay | Admitting: Physician Assistant

## 2020-10-21 VITALS — BP 120/86 | HR 98 | Temp 98.1°F | Ht 65.0 in | Wt 315.2 lb

## 2020-10-21 DIAGNOSIS — G8929 Other chronic pain: Secondary | ICD-10-CM | POA: Diagnosis not present

## 2020-10-21 DIAGNOSIS — M545 Low back pain, unspecified: Secondary | ICD-10-CM

## 2020-10-21 DIAGNOSIS — F909 Attention-deficit hyperactivity disorder, unspecified type: Secondary | ICD-10-CM

## 2020-10-21 DIAGNOSIS — E669 Obesity, unspecified: Secondary | ICD-10-CM

## 2020-10-21 DIAGNOSIS — R739 Hyperglycemia, unspecified: Secondary | ICD-10-CM | POA: Diagnosis not present

## 2020-10-21 DIAGNOSIS — F331 Major depressive disorder, recurrent, moderate: Secondary | ICD-10-CM

## 2020-10-21 DIAGNOSIS — R198 Other specified symptoms and signs involving the digestive system and abdomen: Secondary | ICD-10-CM

## 2020-10-21 MED ORDER — OZEMPIC (0.25 OR 0.5 MG/DOSE) 2 MG/1.5ML ~~LOC~~ SOPN: 0.5000 mg | PEN_INJECTOR | SUBCUTANEOUS | 3 refills | Status: DC

## 2020-10-21 MED ORDER — AMPHETAMINE-DEXTROAMPHET ER 10 MG PO CP24: 10.0000 mg | ORAL_CAPSULE | ORAL | 0 refills | Status: DC

## 2020-10-21 MED ORDER — AMPHETAMINE-DEXTROAMPHET ER 10 MG PO CP24
10.0000 mg | ORAL_CAPSULE | ORAL | 0 refills | Status: DC
Start: 2020-12-20 — End: 2020-11-20

## 2020-10-21 MED ORDER — VENLAFAXINE HCL ER 150 MG PO CP24: ORAL_CAPSULE | ORAL | 2 refills | Status: DC

## 2020-10-21 MED ORDER — OZEMPIC (0.25 OR 0.5 MG/DOSE) 2 MG/1.5ML ~~LOC~~ SOPN: 0.5000 mg | PEN_INJECTOR | SUBCUTANEOUS | 0 refills | Status: DC

## 2020-10-21 MED ORDER — ARIPIPRAZOLE 2 MG PO TABS: ORAL_TABLET | ORAL | 2 refills | Status: AC

## 2020-10-21 NOTE — Patient Instructions (Addendum)
It was great to see you!  Referral for Physical Therapy if you'd like -- talk to the front desk and if this is something you are interested in, we can start this process just send me a MyChart message.  Work on better set-up for your desk at home  Low back xray and update blood work today  [employer] today I will send in and we can try again with the prior authorization  Referral for Medical Weight Management -- when they call you, you can discuss payment and such  Change Adderall 15 mg immediate release to Adderall 10 mg extended release  I will refill all scripts for 9 months  Referral to GI today to evaluate for IBS  Keep me posted on your symptoms   Take care,  Inda Coke PA-C

## 2020-10-21 NOTE — Progress Notes (Signed)
Veronica Gray is a 25 y.o. female is here to discuss:  I acted as a Education administrator for [employer], PA-C Anselmo Pickler, LPN   History of Present Illness:   Chief Complaint  Patient presents with  . Nerve pain  . Obesity    HPI    Low back and R hip pain Pt c/o pain in her lower back and R hip for a few years. She has pain with walking for more than a few minutes, mostly in her R hip. Once she stops walking and sits, the pain is resolved. She has not taken any medication for this. Mostly R hip. No prior trauma. Symptoms are getting worse with time. Denies: radiation of pain, numbness/tingling, loss of bowel/bladder control.  Depression and ADHD Overall depression is well controlled with current medication effexor 150 mg XR and abilify 2 mg daily. She has noticed that the adderall 15 mg makes her irritable if she takes this consistently. She would like to trial XR version. She has not been on this before. Denies SI/HI.  Obesity/Hyperglycemia Past efforts: Saxenda -- ineffective Metformin -- not trialed due to baseline GI issues Phentermine -- not candidate due to current stimulant use Wegovy -- unaffordable Ozempic -- worked well but insurance did not approve  She is limited by her exercise due to low back and R hip pain. Continues to try to eat a healthy diet as able. She is interested in seeing if Ozempic is covered again.  Alternating constipation/diarrhea Has had referral in the past for possible IBS. She was unable to go to gastroenterology at that time but is now interested in referral. Denies rectal bleeding or unintentional weight loss. She is having intermittent constipation and diarrhea, as well as bloating and abdominal cramping at times.   Health Maintenance Due  Topic Date Due  . Hepatitis C Screening  Never done  . INFLUENZA VACCINE  03/23/2020  . COVID-19 Vaccine (3 - Booster for Pfizer series) 07/05/2020    Past Medical History:  Diagnosis Date  . Anemia    . Anxiety   . Current smoker   . Depression   . Urticaria   . Vitamin D deficiency      Social History   Tobacco Use  . Smoking status: Current Every Day Smoker    Packs/day: 0.50    Types: E-cigarettes  . Smokeless tobacco: Never Used  Vaping Use  . Vaping Use: Every day  Substance Use Topics  . Alcohol use: Yes    Comment: occasionally  . Drug use: No    History reviewed. No pertinent surgical history.  Family History  Problem Relation Age of Onset  . Hypertrophic cardiomyopathy Mother   . Hyperlipidemia Father   . Hypertension Father   . Mental illness Maternal Grandmother   . Stroke Maternal Grandfather   . Hyperlipidemia Paternal Grandmother   . Cancer Paternal Grandfather        stomach  . Other Neg Hx        galactorrhea    PMHx, SurgHx, SocialHx, FamHx, Medications, and Allergies were reviewed in the Visit Navigator and updated as appropriate.   Patient Active Problem List   Diagnosis Date Noted  . Anemia   . IUD (intrauterine device) in place 06/13/2019  . Hypertriglyceridemia 06/13/2019  . Morbid obesity with BMI of 45.0-49.9, adult ([place]) 08/09/2018  . Lactose intolerance 08/09/2018  . Vitamin D deficiency 08/09/2018  . History of pituitary adenoma ([place]) 05/20/2017  . ADD (attention deficit disorder), Rx  Adderall 05/20/2017  . Current smoker 02/27/2017  . Depression 02/25/2017  . Anxiety 02/25/2017    Social History   Tobacco Use  . Smoking status: Current Every Day Smoker    Packs/day: 0.50    Types: E-cigarettes  . Smokeless tobacco: Never Used  Vaping Use  . Vaping Use: Every day  Substance Use Topics  . Alcohol use: Yes    Comment: occasionally  . Drug use: No    Current Medications and Allergies:    Current Outpatient Medications:  .  amphetamine-dextroamphetamine (ADDERALL XR) 10 MG 24 hr capsule, Take 1 capsule (10 mg total) by mouth every morning., Disp: 30 capsule, Rfl: 0 .  [START ON 11/20/2020]  amphetamine-dextroamphetamine (ADDERALL XR) 10 MG 24 hr capsule, Take 1 capsule (10 mg total) by mouth every morning., Disp: 30 capsule, Rfl: 0 .  [START ON 12/20/2020] amphetamine-dextroamphetamine (ADDERALL XR) 10 MG 24 hr capsule, Take 1 capsule (10 mg total) by mouth every morning., Disp: 30 capsule, Rfl: 0 .  levonorgestrel (MIRENA) 20 MCG/24HR IUD, 1 each by Intrauterine route once., Disp: , Rfl:  .  Semaglutide,0.25 or 0.5MG /DOS, (OZEMPIC, 0.25 OR 0.5 MG/DOSE,) 2 MG/1.5ML SOPN, Inject 0.5 mg into the skin once a week., Disp: 1.5 mL, Rfl: 3 .  Semaglutide,0.25 or 0.5MG /DOS, (OZEMPIC, 0.25 OR 0.5 MG/DOSE,) 2 MG/1.5ML SOPN, Inject 0.5 mg into the skin once a week., Disp: 1.5 mL, Rfl: 0 .  ARIPiprazole (ABILIFY) 2 MG tablet, TAKE 1 TABLET (2 MG) BY MOUTH DAILY, Disp: 90 tablet, Rfl: 2 .  EPINEPHrine 0.3 mg/0.3 mL IJ SOAJ injection, SMARTSIG:1 Pre-Filled Pen Syringe IM PRN, Disp: , Rfl:  .  venlafaxine XR (EFFEXOR-XR) 150 MG 24 hr capsule, Take 1 capsule daily, Disp: 90 capsule, Rfl: 2   Allergies  Allergen Reactions  . Wellbutrin [Bupropion]     uncontrollable shakes and insomnia     Review of Systems   ROS Negative unless otherwise specified per HPI.  Vitals:   Vitals:   10/21/20 1531  BP: 120/86  Pulse: 98  Temp: 98.1 F (36.7 C)  TempSrc: Temporal  SpO2: 96%  Weight: (!) 315 lb 4 oz (143 kg)  Height: 5\' 5"  (1.651 m)     Body mass index is 52.46 kg/m.   Physical Exam:    Physical Exam Vitals and nursing note reviewed.  Constitutional:      General: She is not in acute distress.    Appearance: She is well-developed. She is not ill-appearing, toxic-appearing or sickly-appearing.  Cardiovascular:     Rate and Rhythm: Normal rate and regular rhythm.     Pulses: Normal pulses.     Heart sounds: Normal heart sounds, S1 normal and S2 normal.     Comments: No LE edema Pulmonary:     Effort: Pulmonary effort is normal.     Breath sounds: Normal breath sounds.   Musculoskeletal:     Comments: No decreased ROM 2/2 pain with flexion/extension, lateral side bends, or rotation. Reproducible tenderness with deep palpation to bilateral paraspinal muscles. Does have bony tenderness to inferior lumbar spine. No evidence of erythema, rash or ecchymosis.    Skin:    General: Skin is warm, dry and intact.  Neurological:     Mental Status: She is alert.     GCS: GCS eye subscore is 4. GCS verbal subscore is 5. GCS motor subscore is 6.  Psychiatric:        Mood and Affect: Mood and affect normal.  Speech: Speech normal.        Behavior: Behavior normal. Behavior is cooperative.      Assessment and Plan:    Cashae was seen today for nerve pain and obesity.  Diagnoses and all orders for this visit:  Chronic midline low back pain without sciatica Chronic. Due to bony tenderness, will order lumbar spine xray. Referral for PT placed. I think her ongoing weight gain is continuing. Recommend follow-up if any new/worsening symptoms. Consider sports medicine referrals. -     DG Lumbar Spine Complete; Future -     Ambulatory referral to Physical Therapy  Moderate episode of recurrent major depressive disorder (HCC) Well controlled. Refill Effexor 150 mg XR daily and Abilify 2 mg daily. Follow-up in 6 months, sooner if concerns. Orders: -     venlafaxine XR (EFFEXOR-XR) 150 MG 24 hr capsule; Take 1 capsule daily  Attention deficit hyperactivity disorder (ADHD), unspecified ADHD type Uncontrolled. Trial Adderall 10 mg XR daily. Follow-up if doesn't help with symptoms or has worsening.  Obesity, unspecified classification, unspecified obesity type, unspecified whether serious comorbidity present; Hyperglycemia Trial Ozempic again. Sample provided today. Will likely need another Prior Authorization. Hope this will be approved as she has done well with this. -     CBC with Differential/Platelet -     Comprehensive metabolic panel -     Amb Ref  to Medical Weight Management -     Hemoglobin A1c  Alternating constipation and diarrhea No red flags warranting urgent GI referral at this time. Routine GI referral placed at this time. Continue bland diets. -     Ambulatory referral to Gastroenterology  Other orders -     ARIPiprazole (ABILIFY) 2 MG tablet; TAKE 1 TABLET (2 MG) BY MOUTH DAILY -     amphetamine-dextroamphetamine (ADDERALL XR) 10 MG 24 hr capsule; Take 1 capsule (10 mg total) by mouth every morning. -     amphetamine-dextroamphetamine (ADDERALL XR) 10 MG 24 hr capsule; Take 1 capsule (10 mg total) by mouth every morning. -     amphetamine-dextroamphetamine (ADDERALL XR) 10 MG 24 hr capsule; Take 1 capsule (10 mg total) by mouth every morning. -     Semaglutide,0.25 or 0.5MG /DOS, (OZEMPIC, 0.25 OR 0.5 MG/DOSE,) 2 MG/1.5ML SOPN; Inject 0.5 mg into the skin once a week. -     Semaglutide,0.25 or 0.5MG /DOS, (OZEMPIC, 0.25 OR 0.5 MG/DOSE,) 2 MG/1.5ML SOPN; Inject 0.5 mg into the skin once a week.   CMA or LPN served as scribe during this visit. History, Physical, and Plan performed by medical provider. The above documentation has been reviewed and is accurate and complete.  Time spent with patient today was 25 minutes which consisted of chart review, discussing diagnosis, work up, treatment answering questions and documentation.   Inda Coke, PA-C [place] [place] 10/21/2020  Follow-up: No follow-ups on file.

## 2020-10-22 ENCOUNTER — Encounter: Payer: Self-pay | Admitting: Physician Assistant

## 2020-10-22 DIAGNOSIS — E8881 Metabolic syndrome: Secondary | ICD-10-CM | POA: Insufficient documentation

## 2020-10-22 LAB — COMPREHENSIVE METABOLIC PANEL
ALT: 41 U/L — ABNORMAL HIGH (ref 0–35)
AST: 39 U/L — ABNORMAL HIGH (ref 0–37)
Albumin: 4.1 g/dL (ref 3.5–5.2)
Alkaline Phosphatase: 78 U/L (ref 39–117)
BUN: 11 mg/dL (ref 6–23)
CO2: 27 mEq/L (ref 19–32)
Calcium: 9.6 mg/dL (ref 8.4–10.5)
Chloride: 99 mEq/L (ref 96–112)
Creatinine, Ser: 0.62 mg/dL (ref 0.40–1.20)
GFR: 122.85 mL/min (ref >60.00)
Glucose, Bld: 108 mg/dL — ABNORMAL HIGH (ref 70–99)
Potassium: 4 mEq/L (ref 3.5–5.1)
Sodium: 136 mEq/L (ref 135–145)
Total Bilirubin: 0.3 mg/dL (ref 0.2–1.2)
Total Protein: 7.6 g/dL (ref 6.0–8.3)

## 2020-10-22 LAB — CBC WITH DIFFERENTIAL/PLATELET
Basophils Absolute: 0.1 10*3/uL (ref 0.0–0.1)
Basophils Relative: 1 % (ref 0.0–3.0)
Eosinophils Absolute: 0.2 10*3/uL (ref 0.0–0.7)
Eosinophils Relative: 2.1 % (ref 0.0–5.0)
HCT: 37.1 % (ref 36.0–46.0)
Hemoglobin: 12.3 g/dL (ref 12.0–15.0)
Lymphocytes Relative: 34 % (ref 12.0–46.0)
Lymphs Abs: 3.1 10*3/uL (ref 0.7–4.0)
MCHC: 33.2 g/dL (ref 30.0–36.0)
MCV: 85 fl (ref 78.0–100.0)
Monocytes Absolute: 0.6 10*3/uL (ref 0.1–1.0)
Monocytes Relative: 6.1 % (ref 3.0–12.0)
Neutro Abs: 5.2 10*3/uL (ref 1.4–7.7)
Neutrophils Relative %: 56.8 % (ref 43.0–77.0)
Platelets: 328 10*3/uL (ref 150.0–400.0)
RBC: 4.36 Mil/uL (ref 3.87–5.11)
RDW: 13.6 % (ref 11.5–15.5)
WBC: 9.1 10*3/uL (ref 4.0–10.5)

## 2020-10-22 LAB — HEMOGLOBIN A1C: Hgb A1c MFr Bld: 5.7 % (ref 4.6–6.5)

## 2020-10-27 ENCOUNTER — Telehealth: Payer: Self-pay | Admitting: *Deleted

## 2020-10-27 MED ORDER — TRULICITY 0.75 MG/0.5ML ~~LOC~~ SOAJ: 0.7500 mg | SUBCUTANEOUS | 2 refills | Status: DC

## 2020-10-27 NOTE — Telephone Encounter (Signed)
Received fax for PA needed for Ozempic 0.25 mg , started PA on Thursday 3/3 through Covermymeds and it said Ozempic not covered plan will cover Trulicity or Metformin ER 500 or 750 mg. Samantha notified said to ask pt if she would like to try Trulicity.

## 2020-10-27 NOTE — Telephone Encounter (Signed)
Spoke to pt told her Ozempic is not covered by insurance but it says Trulicity or Metfromin ER would be covered. Aldona Bar wants to know if you would like to try Trulicity? Pt said yes. Told her okay will send Rx to pharmacy and hopefully will be covered. Pt verbalized understanding.

## 2020-10-29 ENCOUNTER — Telehealth: Payer: Self-pay | Admitting: *Deleted

## 2020-10-29 NOTE — Telephone Encounter (Signed)
Received fax from [place] PA needed for Trulicity. Also received fax from [place] for Prior Auth. Form filled out and faxed back. Awaiting response.

## 2020-10-31 NOTE — Telephone Encounter (Signed)
Received fax today from [employer] to fill out PA form for Ozempic. Form filled out and faxed.

## 2020-11-03 ENCOUNTER — Other Ambulatory Visit: Payer: Self-pay | Admitting: Physician Assistant

## 2020-11-04 NOTE — Telephone Encounter (Signed)
Received faxes from [place] both Trulicity and Ozempic.

## 2020-11-04 NOTE — Telephone Encounter (Signed)
Spoke to pt told her I did Prior Authorization for [employer] and Ozempic both have been denied by insurance. Told pt she can contact her insurance to see if there is something they might cover. Pt verbalized understanding.

## 2020-11-05 ENCOUNTER — Encounter: Payer: Self-pay | Admitting: Physician Assistant

## 2020-11-20 ENCOUNTER — Ambulatory Visit: Payer: Managed Care, Other (non HMO) | Admitting: Physician Assistant

## 2020-11-20 ENCOUNTER — Other Ambulatory Visit: Payer: Self-pay

## 2020-11-20 ENCOUNTER — Other Ambulatory Visit (INDEPENDENT_AMBULATORY_CARE_PROVIDER_SITE_OTHER): Payer: Managed Care, Other (non HMO)

## 2020-11-20 ENCOUNTER — Encounter: Payer: Self-pay | Admitting: Physician Assistant

## 2020-11-20 VITALS — BP 124/80 | HR 96 | Ht 65.0 in | Wt 313.2 lb

## 2020-11-20 DIAGNOSIS — E739 Lactose intolerance, unspecified: Secondary | ICD-10-CM

## 2020-11-20 DIAGNOSIS — R7989 Other specified abnormal findings of blood chemistry: Secondary | ICD-10-CM

## 2020-11-20 DIAGNOSIS — R197 Diarrhea, unspecified: Secondary | ICD-10-CM | POA: Diagnosis not present

## 2020-11-20 LAB — SEDIMENTATION RATE: Sed Rate: 47 mm/hr — ABNORMAL HIGH (ref 0–20)

## 2020-11-20 LAB — C-REACTIVE PROTEIN: CRP: 2 mg/dL (ref 0.5–20.0)

## 2020-11-20 LAB — HEPATIC FUNCTION PANEL
ALT: 47 U/L — ABNORMAL HIGH (ref 0–35)
AST: 37 U/L (ref 0–37)
Albumin: 4.2 g/dL (ref 3.5–5.2)
Alkaline Phosphatase: 79 U/L (ref 39–117)
Bilirubin, Direct: 0 mg/dL (ref 0.0–0.3)
Total Bilirubin: 0.3 mg/dL (ref 0.2–1.2)
Total Protein: 7.7 g/dL (ref 6.0–8.3)

## 2020-11-20 LAB — TSH: TSH: 2.35 u[IU]/mL (ref 0.35–4.50)

## 2020-11-20 MED ORDER — GLYCOPYRROLATE 2 MG PO TABS: 2.0000 mg | ORAL_TABLET | Freq: Every day | ORAL | 3 refills | Status: DC

## 2020-11-20 NOTE — Progress Notes (Signed)
Subjective:    Patient ID: Veronica Gray, female    DOB: [birth year], 25 y.o.   MRN: [id number]  HPI *"[name]" is a pleasant 25 year old white female, new to GI today referred by Inda Coke, PA-C for evaluation of altered bowel habits.  Patient has not had any prior GI evaluation. She has history of a pituitary adenoma, ADD, obesity and anxiety/depression. Patient says she started having problems with her bowels around 2014 and feels that that was secondary to component of lactose intolerance which has gradually worsened over the past several years.  She says the past 3 years she has had ongoing issues with primarily diarrhea with up to 5 episodes per day.  She will have urgency intermittently after eating but not necessarily with every meal.  No melena or hematochezia.  She is not having any ongoing abdominal pain or cramping, no significant bloating or gas.  She occasionally will have a bout of constipation but far more often is dealing with the diarrhea. She usually avoids milk and ice cream but continues to consume other lactose-containing products, also feels that she is sensitive to soy which will aggravate the diarrhea and she has been avoiding.  She drinks occasional diet sodas though not on a daily basis.  No other known food intolerances. She has occasional burping or heartburn usually relieved by Tums this is not occurring on a daily basis and no dysphagia or odynophagia. Family history is negative for by IBD as far she is aware, negative for celiac disease, father has history of colon polyps and a paternal grandfather with stomach cancer. She had labs done 10/21/2020 with normal CBC, AST was mildly elevated at 39 and ALT of 41.  No prior abdominal imaging. She is on Adderall, Abilify, and Effexor all of which she has been on for several years.  Review of Systems Pertinent positive and negative review of systems were noted in the above HPI section.  All other review of systems was  otherwise negative.  Outpatient Encounter Medications as of 11/20/2020  Medication Sig  . amphetamine-dextroamphetamine (ADDERALL XR) 10 MG 24 hr capsule Take 1 capsule (10 mg total) by mouth every morning.  . ARIPiprazole (ABILIFY) 2 MG tablet TAKE 1 TABLET (2 MG) BY MOUTH DAILY  . calcium carbonate (TUMS EX) 750 MG chewable tablet Chew 1 tablet by mouth as needed for heartburn.  [name] EPINEPHrine 0.3 mg/0.3 mL IJ SOAJ injection SMARTSIG:1 Pre-Filled Pen Syringe IM PRN  . glycopyrrolate (ROBINUL) 2 MG tablet Take 1 tablet (2 mg total) by mouth daily with breakfast.  . levonorgestrel (MIRENA) 20 MCG/24HR IUD 1 each by Intrauterine route once.  . venlafaxine XR (EFFEXOR-XR) 150 MG 24 hr capsule Take 1 capsule daily  . [DISCONTINUED] amphetamine-dextroamphetamine (ADDERALL XR) 10 MG 24 hr capsule Take 1 capsule (10 mg total) by mouth every morning.  . [DISCONTINUED] amphetamine-dextroamphetamine (ADDERALL XR) 10 MG 24 hr capsule Take 1 capsule (10 mg total) by mouth every morning.  . [DISCONTINUED] Dulaglutide (TRULICITY) 4.62 VO/3.5KK SOPN Inject 0.75 mg into the skin once a week.  . [DISCONTINUED] Semaglutide,0.25 or 0.5MG /DOS, (OZEMPIC, 0.25 OR 0.5 MG/DOSE,) 2 MG/1.5ML SOPN Inject 0.5 mg into the skin once a week.  . [DISCONTINUED] Semaglutide,0.25 or 0.5MG /DOS, (OZEMPIC, 0.25 OR 0.5 MG/DOSE,) 2 MG/1.5ML SOPN Inject 0.5 mg into the skin once a week.   No facility-administered encounter medications on file as of 11/20/2020.   Allergies  Allergen Reactions  . Wellbutrin [Bupropion]     uncontrollable shakes and insomnia  Patient Active Problem List   Diagnosis Date Noted  . Insulin resistance 10/22/2020  . Anemia   . IUD (intrauterine device) in place 06/13/2019  . Hypertriglyceridemia 06/13/2019  . Morbid obesity with BMI of 45.0-49.9, adult ([place]) 08/09/2018  . Lactose intolerance 08/09/2018  . Vitamin D deficiency 08/09/2018  . History of pituitary adenoma ([place]) 05/20/2017  . ADD  (attention deficit disorder), Rx Adderall 05/20/2017  . Current smoker 02/27/2017  . Depression 02/25/2017  . Anxiety 02/25/2017   Social History   Socioeconomic History  . Marital status: Married    Spouse name: Not on file  . Number of children: Not on file  . Years of education: Not on file  . Highest education level: Not on file  Occupational History  . Not on file  Tobacco Use  . Smoking status: Former Smoker    Packs/day: 0.50  . Smokeless tobacco: Never Used  Vaping Use  . Vaping Use: Every day  Substance and Sexual Activity  . Alcohol use: Yes    Comment: occasionally  . Drug use: Yes    Types: Marijuana    Comment: ocaasionally  . Sexual activity: Yes    Birth control/protection: I.U.D.  Other Topics Concern  . Not on file  Social History Narrative  . Not on file   Social Determinants of Health   Financial Resource Strain: Not on file  Food Insecurity: Not on file  Transportation Needs: Not on file  Physical Activity: Not on file  Stress: Not on file  Social Connections: Not on file  Intimate Partner Violence: Not on file    Ms. Diven's family history includes Cancer in her paternal grandfather; Colon polyps in her father and maternal grandmother; Hyperlipidemia in her father and paternal grandmother; Hypertension in her father; Hypertrophic cardiomyopathy in her mother; Mental illness in her maternal grandmother; Stroke in her maternal grandfather.      Objective:    Vitals:   11/20/20 1421  BP: 124/80  Pulse: 96    Physical Exam Well-developed well-nourished white female in no acute distress.  Height, Weight 313, BMI 52.1  HEENT; nontraumatic normocephalic, EOMI, PE R LA, sclera anicteric. Oropharynx; not examined Neck; supple, no JVD Cardiovascular; regular rate and rhythm with S1-S2, no murmur rub or gallop Pulmonary; Clear bilaterally Abdomen; soft ,obese,, nontender, nondistended, no palpable mass or hepatosplenomegaly, bowel sounds are  active Rectal; not done today Skin; benign exam, no jaundice rash or appreciable lesions Extremities; no clubbing cyanosis or edema skin warm and dry Neuro/Psych; alert and oriented x4, grossly nonfocal mood and affect appropriate       Assessment & Plan:   #66 25 year old female with 3-year history of frequent episodes of diarrhea, up to 5 times per day.  Very occasional bout of constipation.  No abdominal pain or cramping or bleeding.  , Suspect she does have lactose intolerance, and is intolerant to soy Rule out celiac disease, doubt IBD Probable IBS-D  #2 morbid obesity #3.  ADD #4.  Anxiety/depression #5.  History of pituitary adenoma 6.  Mild transaminitis on recent lab   Plan; Will proceed with breath hydrogen/breath test to rule out lactose intolerance.  I think a definitive diagnosis will help her decide about continuing to consume any lactose. Avoid soy Avoid artificial sweeteners Repeat hepatic panel today, TTG and IgA, sed rate, CRP Schedule for upper abdominal ultrasound Start trial of glycopyrrolate 2 mg p.o. every morning  will plan to see in follow-up in about 4 weeks.  Patient  will be established with Dr. Lucille Passy PA-C 11/20/2020   Cc: Inda Coke, [place]

## 2020-11-20 NOTE — Patient Instructions (Addendum)
If you are age 27 or older, your body mass index should be between 23-30. Your Body mass index is 52.13 kg/m. If this is out of the aforementioned range listed, please consider follow up with your Primary Care Provider.  If you are age 64 or younger, your body mass index should be between 19-25. Your Body mass index is 52.13 kg/m. If this is out of the aformentioned range listed, please consider follow up with your Primary Care Provider.   Your provider has requested that you go to the basement level for lab work before leaving today. Press "B" on the elevator. The lab is located at the first door on the left as you exit the elevator.  You are going to be mailed a testing kit to check your Lactose Tolerance, which is completed by a company named [facility]. Make sure to return your test in the mail using the return mailing label given to you along with the kit. Your demographic and insurance information have already been sent to the company and they should be in contact with you over the next week regarding this test. [facility] will collect an upfront charge of $99.74 for commercial insurance plans and $209.74 is you are paying cash. Make sure to discuss with [facility] PRIOR to having the test if they have gotten informatoin from your insurance company as to how much your testing will cost out of pocket, if any. Please keep in mind that you will be getting a call from phone number [phone] or a similar number. If you do not hear from them within this time frame, please call our office at [phone].   You will be contacted by [facility] in the next 2 days to arrange a Abdominal Ultra Sound.  The number on your caller ID will be [phone], please answer when they call.  If you have not heard from them in 2 days please call [phone] to schedule.    START Glycopyrrolate 2 mg 1 tablet every morning this has been sent to your pharmacy.  Stay off of Soy and  Artificial Sweeteners.   You have been scheduled to follow up with Amy Esterwood, PA-C on May, 12/2020 at 3:30 pm  Thank you for entrusting me with your care and choosing [facility].  Amy Esterwood, PA-C 

## 2020-11-21 LAB — TISSUE TRANSGLUTAMINASE, IGA: (tTG) Ab, IgA: <1 U/mL

## 2020-11-21 LAB — IGA: Immunoglobulin A: 205 mg/dL (ref 47–310)

## 2020-12-10 ENCOUNTER — Ambulatory Visit (HOSPITAL_COMMUNITY)
Admission: RE | Admit: 2020-12-10 | Discharge: 2020-12-10 | Disposition: A | Discharge status: Home / Self Care | Payer: Managed Care, Other (non HMO) | Source: Ambulatory Visit | Attending: Physician Assistant | Admitting: Physician Assistant

## 2020-12-10 ENCOUNTER — Other Ambulatory Visit: Payer: Self-pay

## 2020-12-10 DIAGNOSIS — R7989 Other specified abnormal findings of blood chemistry: Secondary | ICD-10-CM | POA: Diagnosis present

## 2020-12-10 DIAGNOSIS — E739 Lactose intolerance, unspecified: Secondary | ICD-10-CM | POA: Diagnosis present

## 2020-12-10 DIAGNOSIS — R197 Diarrhea, unspecified: Secondary | ICD-10-CM

## 2020-12-25 ENCOUNTER — Other Ambulatory Visit (INDEPENDENT_AMBULATORY_CARE_PROVIDER_SITE_OTHER): Payer: Managed Care, Other (non HMO)

## 2020-12-25 ENCOUNTER — Encounter: Payer: Self-pay | Admitting: Physician Assistant

## 2020-12-25 ENCOUNTER — Ambulatory Visit: Payer: Managed Care, Other (non HMO) | Admitting: Physician Assistant

## 2020-12-25 VITALS — BP 114/70 | HR 108 | Ht 64.0 in | Wt 311.0 lb

## 2020-12-25 DIAGNOSIS — K580 Irritable bowel syndrome with diarrhea: Secondary | ICD-10-CM

## 2020-12-25 DIAGNOSIS — R197 Diarrhea, unspecified: Secondary | ICD-10-CM | POA: Diagnosis not present

## 2020-12-25 DIAGNOSIS — R7989 Other specified abnormal findings of blood chemistry: Secondary | ICD-10-CM

## 2020-12-25 DIAGNOSIS — K760 Fatty (change of) liver, not elsewhere classified: Secondary | ICD-10-CM | POA: Diagnosis not present

## 2020-12-25 LAB — HEPATIC FUNCTION PANEL
ALT: 53 U/L — ABNORMAL HIGH (ref 0–35)
AST: 43 U/L — ABNORMAL HIGH (ref 0–37)
Albumin: 4.2 g/dL (ref 3.5–5.2)
Alkaline Phosphatase: 80 U/L (ref 39–117)
Bilirubin, Direct: 0 mg/dL (ref 0.0–0.3)
Total Bilirubin: 0.3 mg/dL (ref 0.2–1.2)
Total Protein: 7.7 g/dL (ref 6.0–8.3)

## 2020-12-25 LAB — FERRITIN: Ferritin: 54.6 ng/mL (ref 10.0–291.0)

## 2020-12-25 LAB — SEDIMENTATION RATE: Sed Rate: 56 mm/hr — ABNORMAL HIGH (ref 0–20)

## 2020-12-25 NOTE — Patient Instructions (Addendum)
If you are age 27 or older, your body mass index should be between 23-30. Your Body mass index is 53.38 kg/m. If this is out of the aforementioned range listed, please consider follow up with your Primary Care Provider.  If you are age 58 or younger, your body mass index should be between 19-25. Your Body mass index is 53.38 kg/m. If this is out of the aformentioned range listed, please consider follow up with your Primary Care Provider.   Continue Robinul 2 mg twice a day   Start Benefiber 1-2 teaspoons once a day in 8 oz of water or juice   It was a pleasure to see you today!  Thank you for trusting me with your gastrointestinal care!

## 2020-12-28 LAB — HEPATITIS C ANTIBODY
Hepatitis C Ab: NONREACTIVE
SIGNAL TO CUT-OFF: 0.02 (ref <1.00)

## 2020-12-28 LAB — ALPHA-1-ANTITRYPSIN: A-1 Antitrypsin, Ser: 161 mg/dL (ref 83–199)

## 2020-12-28 LAB — MITOCHONDRIAL ANTIBODIES: Mitochondrial M2 Ab, IgG: <=20 U

## 2020-12-28 LAB — CERULOPLASMIN: Ceruloplasmin: 36 mg/dL (ref 18–53)

## 2020-12-28 LAB — ANA: Anti Nuclear Antibody (ANA): NEGATIVE

## 2020-12-28 NOTE — Progress Notes (Signed)
Subjective:    Patient ID: Veronica Gray, female    DOB: 1995-09-13, 25 y.o.   MRN: [id number]  HPI [name] is a pleasant 25 year old female, recently established at the time of her last office visit, when seen by myself, and established with Dr. Hilarie Fredrickson. She comes in today for follow-up.  She has a 3-year history of frequent episodes of diarrhea up to 5 times per day, very occasional bout of constipation and probable lactose intolerance.  She was started on a trial of glycopyrrolate 2 mg every morning.  Because of recent finding of mild transaminitis she was also set up for upper abdominal ultrasound which showed a diffusely echogenic liver consistent with hepatic steatosis and was otherwise negative.  TTG and IgA were negative, sed rate was elevated at 47.  TSH normal. She was to have breath testing for lactose intolerance but that has not resulted.  She does feel that the glycopyrrolate has been helping and she is now having about 3 bowel movements per day which are a little bit more formed on average.  She is not been seeing any rectal bleeding.  She has no complaints of abdominal pain, appetite has been fine.  Regarding the elevated LFTs there is no regular history of EtOH use, no family history of liver disease that she is aware of.  She relates that she does have an appointment upcoming with weight management.  Review of Systems Pertinent positive and negative review of systems were noted in the above HPI section.  All other review of systems was otherwise negative.  Outpatient Encounter Medications as of 12/25/2020  Medication Sig  . amphetamine-dextroamphetamine (ADDERALL XR) 10 MG 24 hr capsule Take 1 capsule (10 mg total) by mouth every morning.  . ARIPiprazole (ABILIFY) 2 MG tablet TAKE 1 TABLET (2 MG) BY MOUTH DAILY  . calcium carbonate (TUMS EX) 750 MG chewable tablet Chew 1 tablet by mouth as needed for heartburn.  [name] EPINEPHrine 0.3 mg/0.3 mL IJ SOAJ injection SMARTSIG:1 Pre-Filled  Pen Syringe IM PRN  . FLUoxetine (PROZAC) 10 MG capsule Take 20 capsules by mouth daily.  [name] glycopyrrolate (ROBINUL) 2 MG tablet Take 1 tablet (2 mg total) by mouth daily with breakfast.  . levonorgestrel (MIRENA) 20 MCG/24HR IUD 1 each by Intrauterine route once.  . venlafaxine XR (EFFEXOR-XR) 150 MG 24 hr capsule Take 1 capsule daily   No facility-administered encounter medications on file as of 12/25/2020.   Allergies  Allergen Reactions  . Wellbutrin [Bupropion]     uncontrollable shakes and insomnia    Patient Active Problem List   Diagnosis Date Noted  . Insulin resistance 10/22/2020  . Anemia   . IUD (intrauterine device) in place 06/13/2019  . Hypertriglyceridemia 06/13/2019  . Morbid obesity with BMI of 45.0-49.9, adult ([place]) 08/09/2018  . Lactose intolerance 08/09/2018  . Vitamin D deficiency 08/09/2018  . History of pituitary adenoma ([place]) 05/20/2017  . ADD (attention deficit disorder), Rx Adderall 05/20/2017  . Current smoker 02/27/2017  . Depression 02/25/2017  . Anxiety 02/25/2017   Social History   Socioeconomic History  . Marital status: Married    Spouse name: Not on file  . Number of children: Not on file  . Years of education: Not on file  . Highest education level: Not on file  Occupational History  . Not on file  Tobacco Use  . Smoking status: Former Smoker    Packs/day: 0.50  . Smokeless tobacco: Never Used  Vaping Use  . Vaping Use:  Every day  Substance and Sexual Activity  . Alcohol use: Yes    Comment: occasionally  . Drug use: Yes    Types: Marijuana    Comment: ocaasionally  . Sexual activity: Yes    Birth control/protection: I.U.D.  Other Topics Concern  . Not on file  Social History Narrative  . Not on file   Social Determinants of Health   Financial Resource Strain: Not on file  Food Insecurity: Not on file  Transportation Needs: Not on file  Physical Activity: Not on file  Stress: Not on file  Social Connections: Not on file   Intimate Partner Violence: Not on file    Ms. Mascaro's family history includes Cancer in her paternal grandfather; Colon polyps in her father and maternal grandmother; Hyperlipidemia in her father and paternal grandmother; Hypertension in her father; Hypertrophic cardiomyopathy in her mother; Mental illness in her maternal grandmother; Stroke in her maternal grandfather.      Objective:    Vitals:   12/25/20 1533  BP: 114/70  Pulse: (!) 108    Physical Exam Well-developed well-nourished obese young white female in no acute distress.  Height, Weight, 311 BMI 53.3  HEENT; nontraumatic normocephalic, EOMI, PE R LA, sclera anicteric.  Neuro/Psych; alert and oriented x4, grossly nonfocal mood and affect appropriate       Assessment & Plan:   #59 25 year old white female with 3-year history of frequent diarrhea with up to 5 bowel movements per day. Sed rate is elevated at 47, She has seen some improvement with use of glycopyrrolate. High likelihood of lactose intolerance which she is now been avoiding. Still feel symptoms are most consistent with IBS-D but cannot absolutely rule out IBD.  We will also evaluate for pancreatic insufficiency.  #2 diffuse hepatic steatosis and minimal transaminitis with most recent LFTs showed an ALT of 47 I suspect this is on the basis of nonalcoholic fatty liver disease in setting of morbid obesity.  Will rule out autoimmune liver disease and inheritable liver disease, given her very young age  Plan; continue lactose avoidance Continue glycopyrrolate 2 mg p.o. daily to twice daily Stool for lactoferrin, fecal elastase. Of stool for lactoferrin is positive we will proceed with colonoscopy with Dr. Hilarie Fredrickson. We discussed the finding of fatty liver disease, and strong recommendation for weight loss.  She has upcoming appointment with weight management which will be very beneficial. Add trial of Benefiber 1 tablespoon daily in a glass of water. Check  ferritin, ANA, smooth muscle antibody, ceruloplasmin, alpha-1 antitrypsin, mitochondrial antibody and hep C antibody. Further work-up pending results of above.   Sabriah Hobbins S Candido Flott PA-C 12/28/2020   Cc: Inda Coke, [place]

## 2020-12-29 ENCOUNTER — Other Ambulatory Visit: Payer: Managed Care, Other (non HMO)

## 2020-12-29 DIAGNOSIS — K580 Irritable bowel syndrome with diarrhea: Secondary | ICD-10-CM

## 2020-12-29 DIAGNOSIS — R197 Diarrhea, unspecified: Secondary | ICD-10-CM

## 2020-12-29 DIAGNOSIS — K760 Fatty (change of) liver, not elsewhere classified: Secondary | ICD-10-CM

## 2020-12-29 DIAGNOSIS — R7989 Other specified abnormal findings of blood chemistry: Secondary | ICD-10-CM

## 2020-12-30 ENCOUNTER — Ambulatory Visit: Payer: Managed Care, Other (non HMO)

## 2021-01-06 LAB — FECAL LACTOFERRIN, QUANT
Fecal Lactoferrin: NEGATIVE
MICRO NUMBER:: 11865910
SPECIMEN QUALITY:: ADEQUATE

## 2021-01-06 LAB — PANCREATIC ELASTASE, FECAL: Pancreatic Elastase-1, Stool: >500 mcg/g

## 2021-01-15 ENCOUNTER — Other Ambulatory Visit: Payer: Self-pay

## 2021-01-15 ENCOUNTER — Encounter (INDEPENDENT_AMBULATORY_CARE_PROVIDER_SITE_OTHER): Payer: Self-pay | Admitting: Family Medicine

## 2021-01-15 ENCOUNTER — Ambulatory Visit (INDEPENDENT_AMBULATORY_CARE_PROVIDER_SITE_OTHER): Payer: Managed Care, Other (non HMO) | Admitting: Family Medicine

## 2021-01-15 VITALS — BP 121/76 | HR 90 | Temp 98.2°F | Ht 60.0 in | Wt 309.0 lb

## 2021-01-15 DIAGNOSIS — F32A Depression, unspecified: Secondary | ICD-10-CM

## 2021-01-15 DIAGNOSIS — F419 Anxiety disorder, unspecified: Secondary | ICD-10-CM

## 2021-01-15 DIAGNOSIS — R0602 Shortness of breath: Secondary | ICD-10-CM | POA: Diagnosis not present

## 2021-01-15 DIAGNOSIS — Z6841 Body Mass Index (BMI) 40.0 and over, adult: Secondary | ICD-10-CM

## 2021-01-15 DIAGNOSIS — Z1331 Encounter for screening for depression: Secondary | ICD-10-CM | POA: Diagnosis not present

## 2021-01-15 DIAGNOSIS — E559 Vitamin D deficiency, unspecified: Secondary | ICD-10-CM | POA: Diagnosis not present

## 2021-01-15 DIAGNOSIS — Z0289 Encounter for other administrative examinations: Secondary | ICD-10-CM

## 2021-01-15 DIAGNOSIS — K760 Fatty (change of) liver, not elsewhere classified: Secondary | ICD-10-CM

## 2021-01-15 DIAGNOSIS — R7303 Prediabetes: Secondary | ICD-10-CM

## 2021-01-15 DIAGNOSIS — R5383 Other fatigue: Secondary | ICD-10-CM

## 2021-01-15 DIAGNOSIS — Z9189 Other specified personal risk factors, not elsewhere classified: Secondary | ICD-10-CM | POA: Diagnosis not present

## 2021-01-16 LAB — LIPID PANEL WITH LDL/HDL RATIO
Cholesterol, Total: 164 mg/dL (ref 100–199)
HDL: 45 mg/dL (ref >39)
LDL Chol Calc (NIH): 97 mg/dL (ref 0–99)
LDL/HDL Ratio: 2.2 ratio (ref 0.0–3.2)
Triglycerides: 121 mg/dL (ref 0–149)
VLDL Cholesterol Cal: 22 mg/dL (ref 5–40)

## 2021-01-16 LAB — FOLATE: Folate: 8.7 ng/mL (ref >3.0)

## 2021-01-16 LAB — INSULIN, RANDOM: INSULIN: 15.5 u[IU]/mL (ref 2.6–24.9)

## 2021-01-16 LAB — T4: T4, Total: 9.8 ug/dL (ref 4.5–12.0)

## 2021-01-16 LAB — VITAMIN D 25 HYDROXY (VIT D DEFICIENCY, FRACTURES): Vit D, 25-Hydroxy: 21.5 ng/mL — ABNORMAL LOW (ref 30.0–100.0)

## 2021-01-16 LAB — T3: T3, Total: 217 ng/dL — ABNORMAL HIGH (ref 71–180)

## 2021-01-16 LAB — VITAMIN B12: Vitamin B-12: 353 pg/mL (ref 232–1245)

## 2021-01-20 ENCOUNTER — Other Ambulatory Visit: Payer: Self-pay

## 2021-01-20 ENCOUNTER — Ambulatory Visit: Payer: Managed Care, Other (non HMO) | Attending: Physician Assistant

## 2021-01-20 DIAGNOSIS — M6281 Muscle weakness (generalized): Secondary | ICD-10-CM | POA: Diagnosis present

## 2021-01-20 DIAGNOSIS — G8929 Other chronic pain: Secondary | ICD-10-CM | POA: Insufficient documentation

## 2021-01-20 DIAGNOSIS — R293 Abnormal posture: Secondary | ICD-10-CM | POA: Insufficient documentation

## 2021-01-20 DIAGNOSIS — M5441 Lumbago with sciatica, right side: Secondary | ICD-10-CM | POA: Diagnosis not present

## 2021-01-20 NOTE — Patient Instructions (Signed)
Access Code: [email or web address] URL: [email or web address] Date: 01/20/2021 Prepared by: Kathreen Cornfield  Exercises Supine Posterior Pelvic Tilt - 2 x daily - 7 x weekly - 2 sets - 10 reps Supine Transversus Abdominis Bracing - Hands on Stomach - 2 x daily - 7 x weekly - 3 sets - 10 reps Open Books - 2 x daily - 7 x weekly - 1-2 sets - 10-15 reps Thoracic Extension Mobilization on Foam Roll - 2 x daily - 7 x weekly - 3 sets - 10 reps

## 2021-01-20 NOTE — Therapy (Signed)
[place], [place], [address] Phone: [phone]   Fax:  [phone]  Physical Therapy Evaluation  Patient Details  Name: Veronica Gray MRN: [id number] Date of Birth: 1995/11/20 (25) Referring Provider (PT): Inda Coke, [place]   Encounter Date: 01/20/2021   PT End of Session - 01/20/21 1942    Visit Number 1    Number of Visits 8    Date for PT Re-Evaluation 03/17/21    Authorization Type [employer]    Authorization Time Period Foto 6th and 10th visit    PT Start Time 1831    PT Stop Time 1921    PT Time Calculation (min) 50 min    Activity Tolerance Patient tolerated treatment well    Behavior During Therapy [facility] for tasks assessed/performed           Past Medical History:  Diagnosis Date  . ADHD   . Anemia   . Anxiety   . Back pain   . Chronic headache   . Current smoker   . Depression   . Fatty liver   . GERD (gastroesophageal reflux disease)   . Hypertension   . IBS (irritable bowel syndrome)   . Obesity   . Other fatigue   . Shortness of breath on exertion   . Urticaria   . Vitamin D deficiency     No past surgical history on file.  There were no vitals filed for this visit.    Subjective Assessment - 01/20/21 1836    Subjective [name] reports she has low back pain that started before the pandemic. When she goes for walks, she feels like it's stabbing and refers the right hip directly across and sometimes lightly down lateral thigh just midway. She has been having some stomach issues, potentially IBS-D, otherwise denies bowel/bladder issues related to back.    Limitations Standing;Walking;Lifting;House hold activities    How long can you walk comfortably? 0.5 miles    Diagnostic tests XR Lumbar spine: IMPRESSION:  Examination is significantly limited by body habitus and  radiographic underpenetration. Within this limitation, no fracture  or dislocation of the lumbar spine. Disc spaces and  vertebral body  heights are preserved. Lumbar disc and neural foraminal pathology  may be further evaluated by MRI if indicated by neurologically  localizing signs and symptoms.    Patient Stated Goals be able to go on walks and grocery shopping without pain or needing a cart    Currently in Pain? Yes    Pain Score 0-No pain   6.5-7/10   Pain Location Back    Pain Orientation Lower;Right    Pain Descriptors / Indicators Stabbing;Burning    Pain Type Chronic pain    Pain Radiating Towards to right buttock/hip and mid right outer thigh    Pain Onset More than a month ago    Pain Frequency Intermittent    Aggravating Factors  walking, a little bit into extension but does feel better afterward    Pain Relieving Factors sitting, standing slightly, bending forward, sleeping on left side, pyramid to put feet on at desk to elevate, standing desk helps sometimes              [facility] PT Assessment - 01/20/21 0001      Assessment   Medical Diagnosis Midline Low Back Pain    Referring Provider (PT) Inda Coke, PA    Onset Date/Surgical Date --   2 years ago   Hand Dominance Right  Next MD Visit after her provider returns from maternity leave    Prior Therapy None      Precautions   Precautions None      Restrictions   Weight Bearing Restrictions No      Balance Screen   Has the patient fallen in the past 6 months No    Has the patient had a decrease in activity level because of a fear of falling?  Yes   fluctuation   Is the patient reluctant to leave their home because of a fear of falling?  No      Home Ecologist residence    Living Arrangements Spouse/significant other      Prior Function   Level of Independence Independent    Vocation Full time employment    Vocation Requirements sitting a lot    Leisure Walking with husband      Observation/Other Assessments   Observations hypermobility in joints (knee and elbow hyperextension), anterior  pelvic tilt in standing    Focus on Therapeutic Outcomes (FOTO)  56% ability, 65% ability      Posture/Postural Control   Posture/Postural Control Postural limitations    Postural Limitations Anterior pelvic tilt;Increased lumbar lordosis      ROM / Strength   AROM / PROM / Strength AROM      AROM   AROM Assessment Site Lumbar    Lumbar Flexion 50    Lumbar Extension 15   mild pain   Lumbar - Right Side Bend to PFJ line    Lumbar - Left Side Bend just below PFJ line    Lumbar - Right Rotation WFL    Lumbar - Left Rotation WFL      Palpation   Palpation comment TTP R L/S PS, R QL. upper R glutes very tender      Special Tests    Special Tests Lumbar    Lumbar Tests Straight Leg Raise;Slump Test      Slump test   Findings Negative      Straight Leg Raise   Findings Negative    Comment hamstring tightness                      Objective measurements completed on examination: See above findings.               PT Education - 01/20/21 1940    Education Details FOTO, diagnosis, prognosis, anatomy and pelvis alignment, POC, HEP    Person(s) Educated Patient    Methods Explanation;Demonstration;Tactile cues;Verbal cues;Handout    Comprehension Verbalized understanding;Returned demonstration;Verbal cues required;Tactile cues required            PT Short Term Goals - 01/20/21 1958      PT SHORT TERM GOAL #1   Title Pt will be I and compliant with initial HEP.    Baseline provided at eval    Time 3    Period Weeks    Status New    Target Date 02/10/21             PT Long Term Goals - 01/20/21 1959      PT LONG TERM GOAL #1   Title Pt will be independent with advanced HEP for management of symptoms and long term maintenance for any potential reoccurence.    Time 8    Period Weeks    Status New    Target Date 03/17/21      PT LONG TERM  GOAL #2   Title Pt will demonstrate TrA activation in various positions, able to hold contraction in  hooklying for at least 30 seconds while breathing or talking.    Baseline flickering in H/L, able to hold for one exhale    Time 8    Period Weeks    Status New    Target Date 03/17/21      PT LONG TERM GOAL #3   Title Pt will demonstrate neutral lumbar and pelvic alignment without knees locking in standing.    Baseline increase lumbar lordosis and anterior pelvic tilt, as well as locking knees    Time 8    Period Weeks    Status New    Target Date 03/17/21      PT LONG TERM GOAL #4   Title increase FOTO ability to at least 65% ability, in order to demonstrate meaningful change in perceived level of functional ability.    Baseline 56% ability    Time 8    Period Weeks    Status New    Target Date 03/17/21                  Plan - 01/20/21 1948    Clinical Impression Statement Pt is a 24 yo female who presents to OP PT with a 2 year history of right-sided low back pain with referral to R buttock and occasionally midway down right lateral thigh. Pt demonstrates increased anterior pelvic tilt and lumbar lordosis in standing, weak core muscles with flickering of transversus abdominis with hooklying contraction in neutral, general hypermobility in joints, and mildly painful lumbar AROM into extension otherwise unremarkable and WFL. Pt denies saddle parasthesias, bowel/bladder changes aside from probable IBS diagnosis, and has normal sensation in B LE. She was educated on FOTO, diagnosis, prognosis, anatomy and pelvis alignment, POC, HEP and verbalized understanding and consent to tx. Pt would benefit from skilled physical therapy 1x/week for 6-8 weeks to to correct lumbopelvic alignment, increase core strength and overall core stabilization for decreased low back pain and improved function.    Personal Factors and Comorbidities Past/Current Experience;Comorbidity 2;Fitness;Time since onset of injury/illness/exacerbation;Profession    Comorbidities HTN, anemia    Examination-Activity  Limitations Lift;Squat;Locomotion Level;Stairs;Stand    Examination-Participation Restrictions Shop;Community Activity;Occupation;Cleaning    Stability/Clinical Decision Making Stable/Uncomplicated    Clinical Decision Making Low    Rehab Potential Good    PT Frequency 1x / week    PT Duration 8 weeks    PT Treatment/Interventions ADLs/Self Care Home Management;Iontophoresis 4mg /ml Dexamethasone;Electrical Stimulation;Cryotherapy;Moist Heat;Gait training;Stair training;Functional mobility training;Therapeutic activities;Aquatic Therapy;Therapeutic exercise;Balance training;Neuromuscular re-education;Patient/family education;Manual techniques;Passive range of motion;Dry needling;Taping;Spinal Manipulations;Joint Manipulations    PT Next Visit Plan Assess response to HEP/update PRN, assess spinal mobility further and hip flexibility, pelvic alignment, core stabilization    PT Home Exercise Plan [id number]    Consulted and Agree with Plan of Care Patient           Patient will benefit from skilled therapeutic intervention in order to improve the following deficits and impairments:  Difficulty walking,Hypermobility,Increased fascial restricitons,Postural dysfunction,Pain,Obesity,Improper body mechanics,Impaired perceived functional ability,Decreased activity tolerance,Decreased strength  Visit Diagnosis: Chronic right-sided low back pain with right-sided sciatica  Abnormal posture  Muscle weakness (generalized)     Problem List Patient Active Problem List   Diagnosis Date Noted  . Insulin resistance 10/22/2020  . Anemia   . IUD (intrauterine device) in place 06/13/2019  . Hypertriglyceridemia 06/13/2019  . Morbid obesity with BMI of 45.0-49.9, adult ([place])  08/09/2018  . Lactose intolerance 08/09/2018  . Vitamin D deficiency 08/09/2018  . History of pituitary adenoma ([place]) 05/20/2017  . ADD (attention deficit disorder), Rx Adderall 05/20/2017  . Current smoker 02/27/2017  .  Depression 02/25/2017  . Anxiety 02/25/2017    Izell [place]kan[place], PT, DPT 01/20/2021, 8:05 PM  [facility] [address] [place], [place], [address] Phone: [phone]   Fax:  [phone]  Name: Tashonna Descoteaux MRN: [id number] Date of Birth: February 15, 1996

## 2021-01-22 NOTE — Progress Notes (Signed)
Dear Inda Coke, PA,   Thank you for referring Veronica Gray to our clinic. The following note includes my evaluation and treatment recommendations.  Chief Complaint:   OBESITY Veronica Gray (MR# [id number]) is a 25 y.o. female who presents for evaluation and treatment of obesity and related comorbidities. Current BMI is Body mass index is 60.35 kg/m. Veronica Gray has been struggling with her weight for many years and has been unsuccessful in either losing weight, maintaining weight loss, or reaching her healthy weight goal.  Veronica Gray is currently in the action stage of change and ready to dedicate time achieving and maintaining a healthier weight. Veronica Gray is interested in becoming our patient and working on intensive lifestyle modifications including (but not limited to) diet and exercise for weight loss.  Veronica Gray heard about the clinic from Dr. Juleen China. Breakfast- 1.5 cups Special K or Frosted Flakes with 1 cup soy or oat milk or oatmeal (1 cup cooked) with 1 tbsp peanut butter (feels satisfied), occasional coffee from [employer]; Lunch- leftovers from day before (2 slices pizza from [place]) (hungry and then feels full); Occasional snacks carrots and ranch or popcorn; Dinner- ~6 PM rice and meat.  Maryellen's habits were reviewed today and are as follows: Her family eats meals together, she thinks her family will eat healthier with her, she struggles with family and or coworkers weight loss sabotage, her desired weight loss is 109 lbs, she started gaining weight around age 55-18, her heaviest weight ever was 318 pounds, she has significant food cravings issues, she snacks frequently in the evenings, she skips meals frequently, she is frequently drinking liquids with calories, she frequently makes poor food choices, she frequently eats larger portions than normal, she has binge eating behaviors and she struggles with emotional eating.  Depression Screen Veronica Gray's Food and Mood (modified PHQ-9) score  was 20.  Depression screen Veronica Gray 2/9 01/15/2021  Decreased Interest 3  Down, Depressed, Hopeless 3  PHQ - 2 Score 6  Altered sleeping 0  Tired, decreased energy 1  Change in appetite 2  Feeling bad or failure about yourself  3  Trouble concentrating 3  Moving slowly or fidgety/restless 2  Suicidal thoughts 3  PHQ-9 Score 20  Difficult doing work/chores Very difficult   Subjective:   1. Other fatigue Rynlee admits to daytime somnolence and admits to waking up still tired. Patent has a history of symptoms of daytime fatigue and morning fatigue. Akacia generally gets  8-12  hours of sleep per night, and states that she has generally restful sleep. Snoring is present. Apneic episodes are present. Epworth Sleepiness Score is 4. EKG sinus rhythm tachycardia at 101 bpm.  2. Shortness of breath on exertion Veronica Gray notes increasing shortness of breath with exercising and seems to be worsening over time with weight gain. She notes getting out of breath sooner with activity than she used to. This has gotten worse recently. Jahnya denies shortness of breath at rest or orthopnea. EKG sinus rhythm tachycardia at 101 bpm.  3. NAFLD (nonalcoholic fatty liver disease) Veronica Gray was recently diagnosed by GI and ultrasound.  4. Pre-diabetes Veronica Gray's A1c was 5.7 on 10/21/2020.  5. Anxiety and depression Veronica Gray is on Abilify and Prozac (switching over from Effexor). She sees Veronica Gray.  6. Vitamin D deficiency Veronica Gray is not currently on Vit De supplementation. She reports fatigue.  7. At risk for osteoporosis Veronica Gray is at higher risk of osteopenia and osteoporosis due to Vitamin D deficiency.   Assessment/Plan:   1. Other  fatigue Veronica Gray does not feel that her weight is causing her energy to be lower than it should be. Fatigue may be related to obesity, depression or many other causes. Labs will be ordered, and in the meanwhile, Veronica Gray will focus on self care including making healthy  food choices, increasing physical activity and focusing on stress reduction. Check labs today.  - Vitamin B12 - EKG 12-Lead - Folate - T3 - T4  2. Shortness of breath on exertion Aslee does feel that she gets out of breath more easily that she used to when she exercises. Hiilei's shortness of breath appears to be obesity related and exercise induced. She has agreed to work on weight loss and gradually increase exercise to treat her exercise induced shortness of breath. Will continue to monitor closely.  3. NAFLD (nonalcoholic fatty liver disease) We discussed the likely diagnosis of non-alcoholic fatty liver disease today and how this condition is obesity related. Belkis was educated the importance of weight loss. Veronica Gray agreed to continue with her weight loss efforts with healthier diet and exercise as an essential part of her treatment plan.  - Lipid Panel With LDL/HDL Ratio  4. Pre-diabetes Veronica Gray will continue to work on weight loss, exercise, and decreasing simple carbohydrates to help decrease the risk of diabetes.   - Insulin, random  5. Anxiety and depression Behavior modification techniques were discussed today to help Veronica Gray deal with her anxiety.  Orders and follow up as documented in patient record. Behavior modification techniques were discussed today to help Veronica Gray deal with her emotional/non-hunger eating behaviors.  Orders and follow up as documented in patient record.  -Follow up at next appt on symptoms.  6. Vitamin D deficiency Low Vitamin D level contributes to fatigue and are associated with obesity, breast, and colon cancer. She agrees to follow-up for routine testing of Vitamin D, at least 2-3 times per year to avoid over-replacement.  - VITAMIN D 25 Hydroxy (Vit-D Deficiency, Fractures)  7. Screening for depression Aniesha had a positive depression screening. Depression is commonly associated with obesity and often results in emotional eating behaviors.  We will monitor this closely and work on CBT to help improve the non-hunger eating patterns. Referral to Psychology may be required if no improvement is seen as she continues in our clinic.  8. At risk for osteoporosis Veronica Gray was given approximately 15 minutes of osteoporosis prevention counseling today. Veronica Gray is at risk for osteopenia and osteoporosis due to her Vitamin D deficiency. She was encouraged to take her Vitamin D and follow her higher calcium diet and increase strengthening exercise to help strengthen her bones and decrease her risk of osteopenia and osteoporosis.  Veronica Gray spaced learning was employed today to elicit superior memory formation and behavioral change.  9. Class 3 severe obesity with serious comorbidity and body mass index (BMI) of 50.0 to 59.9 in adult, unspecified obesity type (HCC) Camren is currently in the action stage of change and her goal is to continue with weight loss efforts. I recommend Ohanna begin the structured treatment plan as follows:  She has agreed to the Category 4 Plan.  Exercise goals: No exercise has been prescribed at this time.   Behavioral modification strategies: increasing lean protein intake, meal planning and cooking strategies and keeping healthy foods in the home.  She was informed of the importance of frequent follow-up visits to maximize her success with intensive lifestyle modifications for her multiple health conditions. She was informed we would discuss her lab results at her  next visit unless there is a critical issue that needs to be addressed sooner. Jocilynn agreed to keep her next visit at the agreed upon time to discuss these results.  Objective:   Blood pressure 121/76, pulse 90, temperature 98.2 F (36.8 C), height 5' (1.524 m), weight (!) 309 lb (140.2 kg), SpO2 96 %. Body mass index is 60.35 kg/m.  EKG: Sinus tachycardia, rate 101.  Indirect Calorimeter completed today shows a VO2 of 328 and a REE of 2285.  Her  calculated basal metabolic rate is 1027 thus her basal metabolic rate is better than expected.  General: Cooperative, alert, well developed, in no acute distress. HEENT: Conjunctivae and lids unremarkable. Cardiovascular: Regular rhythm.  Lungs: Normal work of breathing. Neurologic: No focal deficits.   Lab Results  Component Value Date   CREATININE 0.62 10/21/2020   BUN 11 10/21/2020   NA 136 10/21/2020   K 4.0 10/21/2020   CL 99 10/21/2020   CO2 27 10/21/2020   Lab Results  Component Value Date   ALT 53 (H) 12/25/2020   AST 43 (H) 12/25/2020   ALKPHOS 80 12/25/2020   BILITOT 0.3 12/25/2020   Lab Results  Component Value Date   HGBA1C 5.7 10/21/2020   HGBA1C 5.0 (A) 07/13/2019   HGBA1C 5.6 03/16/2019   Lab Results  Component Value Date   INSULIN 15.5 01/15/2021   Lab Results  Component Value Date   TSH 2.35 11/20/2020   Lab Results  Component Value Date   CHOL 164 01/15/2021   HDL 45 01/15/2021   LDLCALC 97 01/15/2021   LDLDIRECT 90.0 07/13/2019   TRIG 121 01/15/2021   CHOLHDL 4 07/13/2019   Lab Results  Component Value Date   WBC 9.1 10/21/2020   HGB 12.3 10/21/2020   HCT 37.1 10/21/2020   MCV 85.0 10/21/2020   PLT 328.0 10/21/2020   Lab Results  Component Value Date   FERRITIN 54.6 12/25/2020    Attestation Statements:   Reviewed by clinician on day of visit: allergies, medications, problem list, medical history, surgical history, family history, social history, and previous encounter notes.  Coral Ceo, CMA, am acting as transcriptionist for Coralie Common, MD.  This is the patient's first visit at [facility]. The patient's NEW PATIENT PACKET was reviewed at length. Included in the packet: current and past health history, medications, allergies, ROS, gynecologic history (women only), surgical history, family history, social history, weight history, weight loss surgery history (for those that have had weight loss  surgery), nutritional evaluation, mood and food questionnaire, PHQ9, Epworth questionnaire, sleep habits questionnaire, patient life and health improvement goals questionnaire. These will all be scanned into the patient's chart under media.   During the visit, I independently reviewed the patient's EKG, bioimpedance scale results, and indirect calorimeter results. I used this information to tailor a meal plan for the patient that will help her to lose weight and will improve her obesity-related conditions going forward. I performed a medically necessary appropriate examination and/or evaluation. I discussed the assessment and treatment plan with the patient. The patient was provided an opportunity to ask questions and all were answered. The patient agreed with the plan and demonstrated an understanding of the instructions. Labs were ordered at this visit and will be reviewed at the next visit unless more critical results need to be addressed immediately. Clinical information was updated and documented in the EMR.   Time spent on visit including pre-visit chart review and post-visit care was 45  minutes.   A separate 15 minutes was spent on risk counseling (see above).   I have reviewed the above documentation for accuracy and completeness, and I agree with the above. - Jinny Blossom, MD

## 2021-01-28 ENCOUNTER — Ambulatory Visit: Payer: Managed Care, Other (non HMO) | Admitting: Physical Therapy

## 2021-01-29 ENCOUNTER — Ambulatory Visit (INDEPENDENT_AMBULATORY_CARE_PROVIDER_SITE_OTHER): Payer: Managed Care, Other (non HMO) | Admitting: Family Medicine

## 2021-01-29 ENCOUNTER — Other Ambulatory Visit: Payer: Self-pay

## 2021-01-29 ENCOUNTER — Encounter (INDEPENDENT_AMBULATORY_CARE_PROVIDER_SITE_OTHER): Payer: Self-pay | Admitting: Family Medicine

## 2021-01-29 VITALS — BP 114/74 | HR 86 | Temp 97.8°F | Ht 60.0 in | Wt 307.0 lb

## 2021-01-29 DIAGNOSIS — Z9189 Other specified personal risk factors, not elsewhere classified: Secondary | ICD-10-CM

## 2021-01-29 DIAGNOSIS — E559 Vitamin D deficiency, unspecified: Secondary | ICD-10-CM

## 2021-01-29 DIAGNOSIS — R7303 Prediabetes: Secondary | ICD-10-CM

## 2021-01-29 DIAGNOSIS — Z6841 Body Mass Index (BMI) 40.0 and over, adult: Secondary | ICD-10-CM

## 2021-01-29 MED ORDER — VITAMIN D (ERGOCALCIFEROL) 1.25 MG (50000 UNIT) PO CAPS
50000.0000 [IU] | ORAL_CAPSULE | ORAL | 0 refills | Status: DC
Start: 2021-01-29 — End: 2021-02-12

## 2021-02-03 NOTE — Progress Notes (Signed)
Chief Complaint:   OBESITY Veronica Gray is here to discuss her progress with her obesity treatment plan along with follow-up of her obesity related diagnoses. Veronica Gray is on the Category 4 Plan and states she is following her eating plan approximately 60% of the time. Veronica Gray states she is completing physical therapy for her back in the PT office and at home exercises for 20-45 minutes 5-7 times per week.  Today's visit was #: 2 Starting weight: 309 lbs Starting date: 01/15/2021 Today's weight: 307 lbs Today's date: 01/29/2021 Total lbs lost to date: 2 lbs Total lbs lost since last in-office visit: 2  Interim History: Veronica Gray had some concerns today at her first follow up visit. She shared that her biggest concerns were the quantity of food and the expense. She reported that she had been doing the egg option for breakfast for the first few days and then switched to the other option. Sometimes she would still skip breakfast completely. For lunch, she had been having a [place] sandwich initially. When she ran out of [place], she switched to some left over rice and Tika Masala. She does report however, that despite the quantity of food, she was able to [place] everything at dinner. For snaks, she reports that she has been cutting up cucumber, tomato, and baby tomatoes.  Subjective:   1. Vitamin D deficiency Veronica Gray's last Vitamin D level was 21.5. She does report positive symptoms of fatigue. She currently takes OTC Vitamin D 1,000 IU tablets daily.   2. Pre-diabetes Veronica Gray's A1c is in the Prediabetes range, and her insulin level is 15.5. She is not currently on medications for either of these conditions.   3. At risk for diabetes mellitus  Veronica Gray is at higher than average risk for developing diabetes due to obesity.   Assessment/Plan:   1. Vitamin D deficiency Low Vitamin D level contributes to fatigue and are associated with obesity, breast, and colon cancer. She agrees to start to take prescription  Vitamin D @50 ,000 IU every week and will follow-up for routine testing of Vitamin D, at least 2-3 times per year to avoid over-replacement.  - Start: Vitamin D, Ergocalciferol, (DRISDOL) 1.25 MG (50000 UNIT) CAPS capsule; Take 1 capsule (50,000 Units total) by mouth every 7 (seven) days.  Dispense: 4 capsule; Refill: 0  2. Pre-diabetes Veronica Gray will continue to work on weight loss, exercise, and decreasing simple carbohydrates to help decrease the risk of diabetes.  Continue weight loss efforts and recheck labs in 3 months. If weight loss stalls or hunger increases, we will consider medications to help mitigate these issues.  3. At risk for diabetes mellitus Veronica Gray was given approximately 30 minutes of diabetes education and counseling today. We discussed intensive lifestyle modifications today with an emphasis on weight loss as well as increasing exercise and decreasing simple carbohydrates in her diet. We also reviewed medication options with an emphasis on risk versus benefit of those discussed.   Repetitive spaced learning was employed today to elicit superior memory formation and behavioral change.  4. Class 3 severe obesity with serious comorbidity and body mass index (BMI) of 50.0 to 59.9 in adult, unspecified obesity type (HCC) Veronica Gray is currently in the action stage of change. As such, her goal is to continue with weight loss efforts. She has agreed to the Category 4 Plan and keeping a food journal and adhering to recommended goals of 400-600 calories and 40+g of protein at lunch.   Exercise goals: No exercise has been prescribed  at this time.  Behavioral modification strategies: increasing lean protein intake, no skipping meals, meal planning and cooking strategies, and planning for success.  Veronica Gray has agreed to follow-up with our clinic in 3 weeks. She was informed of the importance of frequent follow-up visits to maximize her success with intensive lifestyle modifications for her  multiple health conditions.  Objective:   Blood pressure 114/74, pulse 86, temperature 97.8 F (36.6 C), height 5' (1.524 m), weight (!) 307 lb (139.3 kg), SpO2 94 %. Body mass index is 59.96 kg/m.  General: Cooperative, alert, well developed, in no acute distress. HEENT: Conjunctivae and lids unremarkable. Cardiovascular: Regular rhythm.  Lungs: Normal work of breathing. Neurologic: No focal deficits.   Lab Results  Component Value Date   CREATININE 0.62 10/21/2020   BUN 11 10/21/2020   NA 136 10/21/2020   K 4.0 10/21/2020   CL 99 10/21/2020   CO2 27 10/21/2020   Lab Results  Component Value Date   ALT 53 (H) 12/25/2020   AST 43 (H) 12/25/2020   ALKPHOS 80 12/25/2020   BILITOT 0.3 12/25/2020   Lab Results  Component Value Date   HGBA1C 5.7 10/21/2020   HGBA1C 5.0 (A) 07/13/2019   HGBA1C 5.6 03/16/2019   Lab Results  Component Value Date   INSULIN 15.5 01/15/2021   Lab Results  Component Value Date   TSH 2.35 11/20/2020   Lab Results  Component Value Date   CHOL 164 01/15/2021   HDL 45 01/15/2021   LDLCALC 97 01/15/2021   LDLDIRECT 90.0 07/13/2019   TRIG 121 01/15/2021   CHOLHDL 4 07/13/2019   Lab Results  Component Value Date   WBC 9.1 10/21/2020   HGB 12.3 10/21/2020   HCT 37.1 10/21/2020   MCV 85.0 10/21/2020   PLT 328.0 10/21/2020   Lab Results  Component Value Date   FERRITIN 54.6 12/25/2020   Attestation Statements:   Reviewed by clinician on day of visit: allergies, medications, problem list, medical history, surgical history, family history, social history, and previous encounter notes.  I, Noah Mincy,LPN am acting as transcriptionist for Coralie Common, MD.  I have reviewed the above documentation for accuracy and completeness, and I agree with the above. - Jinny Blossom, MD

## 2021-02-05 ENCOUNTER — Ambulatory Visit: Payer: Managed Care, Other (non HMO) | Attending: Physician Assistant | Admitting: Physical Therapy

## 2021-02-05 ENCOUNTER — Other Ambulatory Visit: Payer: Self-pay

## 2021-02-05 ENCOUNTER — Encounter: Payer: Self-pay | Admitting: Physical Therapy

## 2021-02-05 DIAGNOSIS — G8929 Other chronic pain: Secondary | ICD-10-CM

## 2021-02-05 DIAGNOSIS — M5441 Lumbago with sciatica, right side: Secondary | ICD-10-CM | POA: Insufficient documentation

## 2021-02-05 DIAGNOSIS — M6281 Muscle weakness (generalized): Secondary | ICD-10-CM | POA: Insufficient documentation

## 2021-02-05 DIAGNOSIS — R293 Abnormal posture: Secondary | ICD-10-CM | POA: Diagnosis present

## 2021-02-05 NOTE — Patient Instructions (Signed)
Access Code: [email or web address] URL: [email or web address] Date: 02/05/2021 Prepared by: Pollyann Samples  NEW Program Notes All exercises with abdominal setting.  Seated Table Hamstring Stretch - 1 x daily - 7 x weekly - 2 sets - 30 hold Standing Lumbar Extension - 6 x daily - 7 x weekly - 1 sets - 10 reps LOW AND HIGH Doorway Pec stretch. - 1 x daily - 7 x weekly - 2 sets - 10 reps - 30sec hold Doorway Pec Stretch at 60 Elevation - 1 x daily - 7 x weekly - 2 sets - 30sec hold Standing Gastroc Stretch on Foam 1/2 Roll - 1 x daily - 7 x weekly - 2 sets - 30sec hold Seated Scapular Retraction - 1 x daily - 7 x weekly - 10 reps - 5 sec hold

## 2021-02-05 NOTE — Therapy (Signed)
[facility] [place], [place], [address] Phone: [phone]   Fax:  [phone]  Physical Therapy Treatment  Patient Details  Name: Veronica Gray MRN: [id number] Date of Birth: February 27, 1996 Referring Provider (PT): Inda Coke, [place]   Encounter Date: 02/05/2021   PT End of Session - 02/05/21 1945     Visit Number 2    Number of Visits 8    Date for PT Re-Evaluation 03/17/21    Authorization Type [employer]    Authorization Time Period Foto 6th and 10th visit    PT Start Time 1831    PT Stop Time 1916    PT Time Calculation (min) 45 min    Activity Tolerance Patient tolerated treatment well    Behavior During Therapy [facility] for tasks assessed/performed             Past Medical History:  Diagnosis Date   ADHD    Anemia    Anxiety    Back pain    Chronic headache    Current smoker    Depression    Fatty liver    GERD (gastroesophageal reflux disease)    Hypertension    IBS (irritable bowel syndrome)    Obesity    Other fatigue    Shortness of breath on exertion    Urticaria    Vitamin D deficiency     History reviewed. No pertinent surgical history.  There were no vitals filed for this visit.   Subjective Assessment - 02/05/21 1831     Subjective Patient reports her back is getting better.  Able to go on longer walks without rest break. Patient reports good compliance with HEP.  Has not had radicular symptoms.    Limitations Standing;Walking;Lifting;House hold activities    Diagnostic tests XR Lumbar spine: IMPRESSION:  Examination is significantly limited by body habitus and  radiographic underpenetration. Within this limitation, no fracture  or dislocation of the lumbar spine. Disc spaces and vertebral body  heights are preserved. Lumbar disc and neural foraminal pathology  may be further evaluated by MRI if indicated by neurologically  localizing signs and symptoms.    Patient Stated Goals be able to  go on walks and grocery shopping without pain or needing a cart    Currently in Pain? Yes    Pain Score 0-No pain   Max since last visit = 4/10   Pain Location Back    Pain Orientation Lower;Right                [facility] PT Assessment - 02/05/21 0001       Assessment   Medical Diagnosis Midline Low Back Pain    Referring Provider (PT) Inda Coke, PA    Next MD Visit after her provider returns from maternity leave    Prior Therapy None      Flexibility   Hamstrings R=44 L=33 from 90/90                           [facility] Adult PT Treatment/Exercise - 02/05/21 0001       Lumbar Exercises: Stretches   Passive Hamstring Stretch 2 reps;30 seconds    Passive Hamstring Stretch Limitations 1/2 long sit    Hip Flexor Stretch Limitations attempted, unable to find position that didn't cause excess lumbar extension    Piriformis Stretch Limitations didn't need    Gastroc Stretch 30 seconds    Gastroc Stretch Limitations slant board  soleus x30    Other Lumbar Stretch Exercise Doorway, low, med, W x30sec ea      Lumbar Exercises: Standing   Heel Raises 20 reps    Other Standing Lumbar Exercises Pallof Press BLUE 2x10 ea dir      Lumbar Exercises: Seated   Other Seated Lumbar Exercises Scap retraction 5sec x10      Lumbar Exercises: Supine   Pelvic Tilt 10 reps    Dead Bug Limitations Alt UE flex/ext x10, then in table top x5    Bridge 10 reps    Bridge Limitations With neutral spine and abdom set                    PT Education - 02/05/21 1944     Education Details Additions to HEP for trunk/hip/ankle flexibility and controlled lumbar extension for symptom management.    Person(s) Educated Patient    Methods Explanation;Demonstration;Verbal cues;Handout    Comprehension Verbalized understanding;Returned demonstration              PT Short Term Goals - 01/20/21 1958       PT SHORT TERM GOAL #1   Title Pt will be I and compliant with  initial HEP.    Baseline provided at eval    Time 3    Period Weeks    Status New    Target Date 02/10/21               PT Long Term Goals - 01/20/21 1959       PT LONG TERM GOAL #1   Title Pt will be independent with advanced HEP for management of symptoms and long term maintenance for any potential reoccurence.    Time 8    Period Weeks    Status New    Target Date 03/17/21      PT LONG TERM GOAL #2   Title Pt will demonstrate TrA activation in various positions, able to hold contraction in hooklying for at least 30 seconds while breathing or talking.    Baseline flickering in H/L, able to hold for one exhale    Time 8    Period Weeks    Status New    Target Date 03/17/21      PT LONG TERM GOAL #3   Title Pt will demonstrate neutral lumbar and pelvic alignment without knees locking in standing.    Baseline increase lumbar lordosis and anterior pelvic tilt, as well as locking knees    Time 8    Period Weeks    Status New    Target Date 03/17/21      PT LONG TERM GOAL #4   Title increase FOTO ability to at least 65% ability, in order to demonstrate meaningful change in perceived level of functional ability.    Baseline 56% ability    Time 8    Period Weeks    Status New    Target Date 03/17/21                   Plan - 02/05/21 1938     Clinical Impression Statement Patient progressing with decreased pain and no radicular symptoms since prior visit.  Patient with tight hamstrings, pecs, calf muscles, hip flexors. Patient however did not feel stretch with piriformis stretch and had difficulty finding hip flexor stretch that didn't result in excess lumbar extension.  Patient also hyperexends knees.  Patient given addition to HEP, will benefit from continued skilled PT to  address deficits and maximize daily tasks without pain.    Comorbidities HTN, anemia    Examination-Activity Limitations Lift;Squat;Locomotion Level;Stairs;Stand     Examination-Participation Restrictions Shop;Community Activity;Occupation;Cleaning    PT Treatment/Interventions ADLs/Self Care Home Management;Iontophoresis 4mg /ml Dexamethasone;Electrical Stimulation;Cryotherapy;Moist Heat;Gait training;Stair training;Functional mobility training;Therapeutic activities;Aquatic Therapy;Therapeutic exercise;Balance training;Neuromuscular re-education;Patient/family education;Manual techniques;Passive range of motion;Dry needling;Taping;Spinal Manipulations;Joint Manipulations    PT Next Visit Plan Assess response to HEP/update PRN, assess spinal mobility further pelvic alignment, continue core stabilization    PT Home Exercise Plan [email or web address]    Consulted and Agree with Plan of Care Patient             Patient will benefit from skilled therapeutic intervention in order to improve the following deficits and impairments:  Difficulty walking, Hypermobility, Increased fascial restricitons, Postural dysfunction, Pain, Obesity, Improper body mechanics, Impaired perceived functional ability, Decreased activity tolerance, Decreased strength  Visit Diagnosis: Chronic right-sided low back pain with right-sided sciatica  Abnormal posture  Muscle weakness (generalized)     Problem List Patient Active Problem List   Diagnosis Date Noted   Insulin resistance 10/22/2020   Anemia    IUD (intrauterine device) in place 06/13/2019   Hypertriglyceridemia 06/13/2019   Morbid obesity with BMI of 45.0-49.9, adult ([place]) 08/09/2018   Lactose intolerance 08/09/2018   Vitamin D deficiency 08/09/2018   History of pituitary adenoma ([place]) 05/20/2017   ADD (attention deficit disorder), Rx Adderall 05/20/2017   Current smoker 02/27/2017   Depression 02/25/2017   Anxiety 02/25/2017    Pollyann Samples, PT 02/05/2021, 7:47 PM  [facility] [address] [place], [place], [address] Phone: [phone]   Fax:   [phone]  Name: Veronica Gray MRN: [id number] Date of Birth: Dec 30, 1995

## 2021-02-11 ENCOUNTER — Encounter (INDEPENDENT_AMBULATORY_CARE_PROVIDER_SITE_OTHER): Payer: Self-pay

## 2021-02-11 ENCOUNTER — Ambulatory Visit: Payer: Managed Care, Other (non HMO)

## 2021-02-12 ENCOUNTER — Ambulatory Visit (INDEPENDENT_AMBULATORY_CARE_PROVIDER_SITE_OTHER): Payer: Managed Care, Other (non HMO) | Admitting: Family Medicine

## 2021-02-12 ENCOUNTER — Other Ambulatory Visit: Payer: Self-pay

## 2021-02-12 ENCOUNTER — Encounter: Payer: Self-pay | Admitting: Physical Therapy

## 2021-02-12 ENCOUNTER — Ambulatory Visit: Payer: Managed Care, Other (non HMO) | Admitting: Physical Therapy

## 2021-02-12 VITALS — BP 121/80 | HR 97 | Temp 97.9°F | Ht 60.0 in | Wt 303.0 lb

## 2021-02-12 DIAGNOSIS — Z6841 Body Mass Index (BMI) 40.0 and over, adult: Secondary | ICD-10-CM | POA: Diagnosis not present

## 2021-02-12 DIAGNOSIS — E559 Vitamin D deficiency, unspecified: Secondary | ICD-10-CM

## 2021-02-12 DIAGNOSIS — R7303 Prediabetes: Secondary | ICD-10-CM | POA: Diagnosis not present

## 2021-02-12 DIAGNOSIS — M6281 Muscle weakness (generalized): Secondary | ICD-10-CM

## 2021-02-12 DIAGNOSIS — Z9189 Other specified personal risk factors, not elsewhere classified: Secondary | ICD-10-CM | POA: Diagnosis not present

## 2021-02-12 DIAGNOSIS — G8929 Other chronic pain: Secondary | ICD-10-CM

## 2021-02-12 DIAGNOSIS — M5441 Lumbago with sciatica, right side: Secondary | ICD-10-CM | POA: Diagnosis not present

## 2021-02-12 DIAGNOSIS — R293 Abnormal posture: Secondary | ICD-10-CM

## 2021-02-12 MED ORDER — VITAMIN D (ERGOCALCIFEROL) 1.25 MG (50000 UNIT) PO CAPS: 50000.0000 [IU] | ORAL_CAPSULE | ORAL | 0 refills | Status: DC

## 2021-02-12 NOTE — Patient Instructions (Signed)
Access Code: [email or web address] URL: [email or web address] Date: 02/12/2021 Prepared by: Pollyann Samples  Program Notes All exercises with abdominal setting.   NEW Exercises  Clamshell with Resistance - 1 x daily - 7 x weekly - 2 sets - 10 reps Sidelying toe taps for glute med strength - 1 x daily - 7 x weekly - 2 sets - 10 reps

## 2021-02-12 NOTE — Therapy (Signed)
[facility], [place], [address] Phone: [phone]   Fax:  [phone]  Physical Therapy Treatment  Patient Details  Name: Veronica Gray MRN: [id number] Date of Birth: [birth year] Referring Provider (PT): Inda Coke, [place]   Encounter Date: 02/12/2021   PT End of Session - 02/12/21 1828     Visit Number 3    Number of Visits 8    Date for PT Re-Evaluation 03/17/21    Authorization Type Cigna    Authorization Time Period Foto 6th and 10th visit    PT Start Time 1829    PT Stop Time 1910    PT Time Calculation (min) 41 min    Activity Tolerance Patient tolerated treatment well    Behavior During Therapy [facility] for tasks assessed/performed             Past Medical History:  Diagnosis Date   ADHD    Anemia    Anxiety    Back pain    Chronic headache    Current smoker    Depression    Fatty liver    GERD (gastroesophageal reflux disease)    Hypertension    IBS (irritable bowel syndrome)    Obesity    Other fatigue    Shortness of breath on exertion    Urticaria    Vitamin D deficiency     History reviewed. No pertinent surgical history.  There were no vitals filed for this visit.   Subjective Assessment - 02/12/21 1832     Subjective Patient reports moderate compliance with HEP, went to [place] this weekend and missed some.    Limitations Standing;Walking;Lifting;House hold activities    Patient Stated Goals be able to go on walks and grocery shopping without pain or needing a cart    Currently in Pain? Yes    Pain Score 0-No pain   Max=3/10 holding wood for husband.   Pain Location Back    Pain Orientation Right;Lower    Pain Descriptors / Indicators Burning    Pain Type Chronic pain    Pain Radiating Towards less radiating symptoms                [facility] PT Assessment - 02/12/21 0001       Assessment   Medical Diagnosis Midline Low Back Pain    Referring Provider (PT)  Inda Coke, PA                           [facility] Adult PT Treatment/Exercise - 02/12/21 0001       Lumbar Exercises: Stretches   Gastroc Stretch 30 seconds    Gastroc Stretch Limitations slant board soleus x30    Other Lumbar Stretch Exercise Doorway, low, med, W x30sec ea      Lumbar Exercises: Aerobic   Nustep L5x66min B UE/LE =8      Lumbar Exercises: Standing   Scapular Retraction 10 reps    Theraband Level (Scapular Retraction) Level 4 (Blue)    Scapular Retraction Limitations 3sec hold    Other Standing Lumbar Exercises Pallof Press BLUE 2x10 ea dir    Other Standing Lumbar Exercises Attention to pelvic alignment      Lumbar Exercises: Supine   Bridge 10 reps    Bridge Limitations With neutral spine and abdom set    Bridge with [employer] 10 reps    Bridge with clamshell 10 reps    Bridge with [employer]  Squeeze Limitations BLUE    Other Supine Lumbar Exercises TrA x20sec      Lumbar Exercises: Sidelying   Clam 10 reps;Both    Clam Limitations BLUE    Hip Abduction 10 reps;Both    Hip Abduction Limitations 2 sets                    PT Education - 02/12/21 1849     Education Details Additions to HEP for hip strength. Discussion for attention to posture and TrA with all lifting, car transfer and practice occasionally throughout the day.    Person(s) Educated Patient    Methods Explanation;Demonstration;Handout    Comprehension Verbalized understanding              PT Short Term Goals - 02/12/21 1853       PT SHORT TERM GOAL #1   Title Pt will be I and compliant with initial HEP.    Baseline provided at eval    Time 3    Period Weeks    Status Achieved               PT Long Term Goals - 01/20/21 1959       PT LONG TERM GOAL #1   Title Pt will be independent with advanced HEP for management of symptoms and long term maintenance for any potential reoccurence.    Time 8    Period Weeks    Status New    Target Date  03/17/21      PT LONG TERM GOAL #2   Title Pt will demonstrate TrA activation in various positions, able to hold contraction in hooklying for at least 30 seconds while breathing or talking.    Baseline flickering in H/L, able to hold for one exhale    Time 8    Period Weeks    Status New    Target Date 03/17/21      PT LONG TERM GOAL #3   Title Pt will demonstrate neutral lumbar and pelvic alignment without knees locking in standing.    Baseline increase lumbar lordosis and anterior pelvic tilt, as well as locking knees    Time 8    Period Weeks    Status New    Target Date 03/17/21      PT LONG TERM GOAL #4   Title increase FOTO ability to at least 65% ability, in order to demonstrate meaningful change in perceived level of functional ability.    Baseline 56% ability    Time 8    Period Weeks    Status New    Target Date 03/17/21                   Plan - 02/12/21 1914     Clinical Impression Statement Patient has progressed with decrased pain, decreased occurance of radicular symptoms.  Patient continues to be challenged by tight hamstrings, pecs, calf muscles, hip flexors. Also challenged by decreased hip strength noted today. Attempting to stretch hip flexors results in lumbar extension. Patient to practice posture including standing without lumbar extension and hyperextended knees. Addition to HEP for hip strength.  Patient will benefit from continued skilled PT to address deficits and maximize daily tasks without pain.    Comorbidities HTN, anemia    Examination-Activity Limitations Lift;Squat;Locomotion Level;Stairs;Stand    Examination-Participation Restrictions Shop;Community Activity;Occupation;Cleaning    PT Treatment/Interventions ADLs/Self Care Home Management;Iontophoresis 4mg /ml Dexamethasone;Electrical Stimulation;Cryotherapy;Moist Heat;Gait training;Stair training;Functional mobility training;Therapeutic activities;Aquatic Therapy;Therapeutic  exercise;Balance training;Neuromuscular re-education;Patient/family  education;Manual techniques;Passive range of motion;Dry needling;Taping;Spinal Manipulations;Joint Manipulations    PT Next Visit Plan Assess response to HEP/update PRN, assess spinal mobility further pelvic alignment, continue core stabilization    PT Home Exercise Plan [email or web address]    Consulted and Agree with Plan of Care Patient             Patient will benefit from skilled therapeutic intervention in order to improve the following deficits and impairments:  Difficulty walking, Hypermobility, Increased fascial restricitons, Postural dysfunction, Pain, Obesity, Improper body mechanics, Impaired perceived functional ability, Decreased activity tolerance, Decreased strength  Visit Diagnosis: Chronic right-sided low back pain with right-sided sciatica  Abnormal posture  Muscle weakness (generalized)     Problem List Patient Active Problem List   Diagnosis Date Noted   Insulin resistance 10/22/2020   Anemia    IUD (intrauterine device) in place 06/13/2019   Hypertriglyceridemia 06/13/2019   Morbid obesity with BMI of 45.0-49.9, adult ([place]) 08/09/2018   Lactose intolerance 08/09/2018   Vitamin D deficiency 08/09/2018   History of pituitary adenoma ([place]) 05/20/2017   ADD (attention deficit disorder), Rx Adderall 05/20/2017   Current smoker 02/27/2017   Depression 02/25/2017   Anxiety 02/25/2017    Pollyann Samples, PT 02/12/2021, 7:19 PM  [facility] [address] [place], [place], [address] Phone: [phone]   Fax:  [phone]  Name: Veronica Gray MRN: [id number] Date of Birth: [birth year]

## 2021-02-17 NOTE — Progress Notes (Signed)
Chief Complaint:   OBESITY Veronica Gray is here to discuss her progress with her obesity treatment plan along with follow-up of her obesity related diagnoses. Veronica Gray is on the Category 4 Plan and keeping a food journal and adhering to recommended goals of 400-600 calories and 40 g protein and states she is following her eating plan approximately 60% of the time. Veronica Gray states she is doing PT 30 minutes 2 times per week.  Today's visit was #: 3 Starting weight: 309 lbs Starting date: 01/15/2021 Today's weight: 303 lbs Today's date: 02/12/2021 Total lbs lost to date: 6 Total lbs lost since last in-office visit: 4  Interim History: Veronica Gray went to see her parents in [place] over last few weeks. She packed her food and her mom cooked dinner while she was away. She has no plans for the 4th of July. Pt's husband will be going to [place] for a hiking and camping trip at some point. She denies hunger. Snacks are 300 calorie Cheez-Its, veggies and ranch, or popcorn.  Subjective:   1. Vitamin D deficiency Veronica Gray denies nausea, vomiting, and muscle weakness but notes fatigue. Her last Vit D level was 21.5.  2. Pre-diabetes Veronica Gray's last A1c was 5.7. She is not on meds. Pt reports minimal carb cravings.  3. At risk for osteoporosis Veronica Gray is at higher risk of osteopenia and osteoporosis due to Vitamin D deficiency.   Assessment/Plan:   1. Vitamin D deficiency Low Vitamin D level contributes to fatigue and are associated with obesity, breast, and colon cancer. She agrees to continue to take prescription Vitamin D @50 ,000 IU every week and will follow-up for routine testing of Vitamin D, at least 2-3 times per year to avoid over-replacement.  Refill- Vitamin D, Ergocalciferol, (DRISDOL) 1.25 MG (50000 UNIT) CAPS capsule; Take 1 capsule (50,000 Units total) by mouth every 7 (seven) days.  Dispense: 4 capsule; Refill: 0  2. Pre-diabetes Veronica Gray will continue to work on weight loss,  exercise, and decreasing simple carbohydrates to help decrease the risk of diabetes.  Continue category 4 meal plan.  3. At risk for osteoporosis Veronica Gray was given approximately 15 minutes of osteoporosis prevention counseling today. Veronica Gray is at risk for osteopenia and osteoporosis due to her Vitamin D deficiency. She was encouraged to take her Vitamin D and follow her higher calcium diet and increase strengthening exercise to help strengthen her bones and decrease her risk of osteopenia and osteoporosis.  Repetitive spaced learning was employed today to elicit superior memory formation and behavioral change.  4. Class 3 severe obesity with serious comorbidity and body mass index (BMI) of 50.0 to 59.9 in adult, unspecified obesity type (HCC)  Veronica Gray is currently in the action stage of change. As such, her goal is to continue with weight loss efforts. She has agreed to the Category 4 Plan.   Exercise goals:  As is  Behavioral modification strategies: increasing lean protein intake, meal planning and cooking strategies, keeping healthy foods in the home, and planning for success.  Veronica Gray has agreed to follow-up with our clinic in 2-3 weeks. She was informed of the importance of frequent follow-up visits to maximize her success with intensive lifestyle modifications for her multiple health conditions.   Objective:   Blood pressure 121/80, pulse 97, temperature 97.9 F (36.6 C), height 5' (1.524 m), weight (!) 303 lb (137.4 kg), SpO2 95 %. Body mass index is 59.18 kg/m.  General: Cooperative, alert, well developed, in no acute distress. HEENT: Conjunctivae and lids unremarkable.  Cardiovascular: Regular rhythm.  Lungs: Normal work of breathing. Neurologic: No focal deficits.   Lab Results  Component Value Date   CREATININE 0.62 10/21/2020   BUN 11 10/21/2020   NA 136 10/21/2020   K 4.0 10/21/2020   CL 99 10/21/2020   CO2 27 10/21/2020   Lab Results  Component Value Date   ALT  53 (H) 12/25/2020   AST 43 (H) 12/25/2020   ALKPHOS 80 12/25/2020   BILITOT 0.3 12/25/2020   Lab Results  Component Value Date   HGBA1C 5.7 10/21/2020   HGBA1C 5.0 (A) 07/13/2019   HGBA1C 5.6 03/16/2019   Lab Results  Component Value Date   INSULIN 15.5 01/15/2021   Lab Results  Component Value Date   TSH 2.35 11/20/2020   Lab Results  Component Value Date   CHOL 164 01/15/2021   HDL 45 01/15/2021   LDLCALC 97 01/15/2021   LDLDIRECT 90.0 07/13/2019   TRIG 121 01/15/2021   CHOLHDL 4 07/13/2019   Lab Results  Component Value Date   WBC 9.1 10/21/2020   HGB 12.3 10/21/2020   HCT 37.1 10/21/2020   MCV 85.0 10/21/2020   PLT 328.0 10/21/2020   Lab Results  Component Value Date   FERRITIN 54.6 12/25/2020   Attestation Statements:   Reviewed by clinician on day of visit: allergies, medications, problem list, medical history, surgical history, family history, social history, and previous encounter notes.  Coral Ceo, CMA, am acting as transcriptionist for Coralie Common, MD.   I have reviewed the above documentation for accuracy and completeness, and I agree with the above. - Jinny Blossom, MD

## 2021-02-18 ENCOUNTER — Ambulatory Visit: Payer: Managed Care, Other (non HMO) | Admitting: Allergy

## 2021-02-18 ENCOUNTER — Other Ambulatory Visit: Payer: Self-pay

## 2021-02-18 ENCOUNTER — Encounter: Payer: Self-pay | Admitting: Allergy

## 2021-02-18 VITALS — BP 120/70 | HR 93 | Temp 98.0°F | Resp 16 | Ht 64.0 in | Wt 307.6 lb

## 2021-02-18 DIAGNOSIS — L509 Urticaria, unspecified: Secondary | ICD-10-CM | POA: Diagnosis not present

## 2021-02-18 DIAGNOSIS — T783XXD Angioneurotic edema, subsequent encounter: Secondary | ICD-10-CM

## 2021-02-18 DIAGNOSIS — T7840XD Allergy, unspecified, subsequent encounter: Secondary | ICD-10-CM

## 2021-02-18 MED ORDER — CETIRIZINE HCL 10 MG PO TABS: 10.0000 mg | ORAL_TABLET | ORAL | 5 refills | Status: AC | PRN

## 2021-02-18 MED ORDER — FAMOTIDINE 20 MG PO TABS: 20.0000 mg | ORAL_TABLET | Freq: Two times a day (BID) | ORAL | 5 refills | Status: AC

## 2021-02-18 NOTE — Patient Instructions (Addendum)
Allergic reaction  Urticaria (hives) Angioedema (swelling)   -During well without further reaction/hives/swelling episodes.   -Etiology of hives and swelling is most likely triggered by dust mite and/or mold exposure.  Hives can be caused by a variety of different triggers including illness/infection, foods, medications, allergens, stings, exercise, pressure, vibrations, extremes of temperature to name a few however majority of the time there is no identifiable trigger.  -Continue avoidance measures for grass pollen, weed pollen, molds, dust mites, cat, dog, horse.  -If hives or swelling return recommend use of H1/H2 blockers: Zyrtec 10 mg and Pepcid 20 mg taken twice a day.  Medications can be weaned down once hive free for a week or two -Should significant symptoms recur or new symptoms occur, a journal is to be kept recording any foods eaten, beverages consumed, medications taken, activities performed, and environmental conditions within a 6 hour time period prior to the onset of symptoms. For any symptoms concerning for anaphylaxis, epinephrine is to be administered and 911 is to be called immediately.  -Follow emergency action plan in case of reaction   Follow-up in 12 months or sooner if needed

## 2021-02-18 NOTE — Progress Notes (Signed)
Follow-up Note  RE: Veronica Gray MRN: [id number] DOB: Jan 02, 1996 Date of Office Visit: 02/18/2021   History of present illness: Veronica Gray is a 25 y.o. female presenting today for follow-up of urticaria and angioedema.  She was last seen in the office on 08/20/2020 by myself.  She denies having any major health changes, surgeries or hospitalizations since her last visit.  She has not had any hives or swelling episodes since last visit.  She states if she is exposed to something that is "mildewy" or moldy will take antihistamine like Zyrtec and no symptoms will occur with [place] that she thinks this is helpful.  She states after the last visit she did take Zyrtec and Pepcid together for a period of time before being able to wean off completely. At this time she denies any nasal or ocular or generalized allergy symptoms.   Review of systems: Review of Systems  Constitutional: Negative.   HENT: Negative.    Eyes: Negative.   Respiratory: Negative.    Cardiovascular: Negative.   Gastrointestinal: Negative.   Musculoskeletal: Negative.   Skin: Negative.   Neurological: Negative.    All other systems negative unless noted above in HPI  Past medical/social/surgical/family history have been reviewed and are unchanged unless specifically indicated below.  No changes  Medication List: Current Outpatient Medications  Medication Sig Dispense Refill   amphetamine-dextroamphetamine (ADDERALL XR) 10 MG 24 hr capsule Take 10 mg by mouth daily.     ARIPiprazole (ABILIFY) 2 MG tablet TAKE 1 TABLET (2 MG) BY MOUTH DAILY 90 tablet 2   aspirin-acetaminophen-caffeine (EXCEDRIN MIGRAINE) 250-250-65 MG tablet Take 1 tablet by mouth as needed for headache.     calcium carbonate (TUMS EX) 750 MG chewable tablet Chew 1 tablet by mouth as needed for heartburn.     cholecalciferol (VITAMIN D) 25 MCG (1000 UNIT) tablet Take 1,000 Units by mouth daily.     diphenhydrAMINE (SOMINEX) 25 MG tablet Take 25 mg  by mouth as needed for sleep. Take 1 tablet by mouth as needed for seasonal allergies and/or animal related allergies.     EPINEPHrine 0.3 mg/0.3 mL IJ SOAJ injection SMARTSIG:1 Pre-Filled Pen Syringe IM PRN     famotidine (PEPCID) 20 MG tablet Take 1 tablet (20 mg total) by mouth 2 (two) times daily. 60 tablet 5   FLUoxetine (PROZAC) 10 MG capsule Take 20 capsules by mouth daily.     glycopyrrolate (ROBINUL) 2 MG tablet Take 1 tablet (2 mg total) by mouth daily with breakfast. 30 tablet 3   levonorgestrel (MIRENA) 20 MCG/24HR IUD 1 each by Intrauterine route once.     melatonin 3 MG TABS tablet Take 1.5 mg by mouth at bedtime. Take 1/2 tablet (1.5mg ) total by mouth at bedtime as needed for sleep.     Vitamin D, Ergocalciferol, (DRISDOL) 1.25 MG (50000 UNIT) CAPS capsule Take 1 capsule (50,000 Units total) by mouth every 7 (seven) days. 4 capsule 0   Wheat Dextrin (BENEFIBER DRINK MIX) PACK Take 1 each by mouth daily.     cetirizine (ZYRTEC) 10 MG tablet Take 1 tablet (10 mg total) by mouth as needed for allergies. Take 1 tablet by mouth daily as needed for seasonal and/or animal related allergies. 30 tablet 5   No current facility-administered medications for this visit.     Known medication allergies: Allergies  Allergen Reactions   Wellbutrin [Bupropion]     uncontrollable shakes and insomnia    Banana    Kiwi Extract  Pineapple      Physical examination: Blood pressure 120/70, pulse 93, temperature 98 F (36.7 C), temperature source Temporal, resp. rate 16, height 5\' 4"  (1.626 m), weight (!) 307 lb 9.6 oz (139.5 kg), SpO2 96 %.  General: Alert, interactive, in no acute distress. HEENT: PERRLA, TMs pearly gray, turbinates non-edematous without discharge, post-pharynx non erythematous. Neck: Supple without lymphadenopathy. Lungs: Clear to auscultation without wheezing, rhonchi or rales. {no increased work of breathing. CV: Normal S1, S2 without murmurs. Abdomen: Nondistended,  nontender. Skin: Warm and dry, without lesions or rashes. Extremities:  No clubbing, cyanosis or edema. Neuro:   Grossly intact.  Diagnositics/Labs: None today  Assessment and plan:   Allergic reaction  Urticaria (hives) Angioedema (swelling)   -During well without further reaction/hives/swelling episodes.   -Etiology of hives and swelling is most likely triggered by dust mite and/or mold exposure.  Hives can be caused by a variety of different triggers including illness/infection, foods, medications, allergens, stings, exercise, pressure, vibrations, extremes of temperature to name a few however majority of the time there is no identifiable trigger.  -Continue avoidance measures for grass pollen, weed pollen, molds, dust mites, cat, dog, horse.  -If hives or swelling return recommend use of H1/H2 blockers: Zyrtec 10 mg and Pepcid 20 mg taken twice a day.  Medications can be weaned down once hive free for a week or two -Should significant symptoms recur or new symptoms occur, a journal is to be kept recording any foods eaten, beverages consumed, medications taken, activities performed, and environmental conditions within a 6 hour time period prior to the onset of symptoms. For any symptoms concerning for anaphylaxis, epinephrine is to be administered and 911 is to be called immediately.  -Follow emergency action plan in case of reaction   Follow-up in 12 months or sooner if needed  I appreciate the opportunity to take part in Sherril's care. Please do not hesitate to contact me with questions.  Sincerely,   Prudy Feeler, MD Allergy/Immunology [facility]

## 2021-02-19 ENCOUNTER — Ambulatory Visit: Payer: Managed Care, Other (non HMO) | Admitting: Physical Therapy

## 2021-02-26 ENCOUNTER — Encounter: Payer: Self-pay | Admitting: Physical Therapy

## 2021-02-26 ENCOUNTER — Other Ambulatory Visit: Payer: Self-pay

## 2021-02-26 ENCOUNTER — Ambulatory Visit: Payer: Managed Care, Other (non HMO) | Attending: Physician Assistant | Admitting: Physical Therapy

## 2021-02-26 DIAGNOSIS — R293 Abnormal posture: Secondary | ICD-10-CM | POA: Diagnosis not present

## 2021-02-26 DIAGNOSIS — M5441 Lumbago with sciatica, right side: Secondary | ICD-10-CM | POA: Insufficient documentation

## 2021-02-26 DIAGNOSIS — G8929 Other chronic pain: Secondary | ICD-10-CM | POA: Diagnosis present

## 2021-02-26 DIAGNOSIS — M6281 Muscle weakness (generalized): Secondary | ICD-10-CM | POA: Insufficient documentation

## 2021-02-26 NOTE — Therapy (Signed)
[place], [place], [address] Phone: [phone]   Fax:  [phone]  Physical Therapy Treatment  Patient Details  Name: Veronica Gray MRN: [id number] Date of Birth: Apr 19, 1996 Referring Provider (PT): Inda Coke, [place]   Encounter Date: 02/26/2021   PT End of Session - 02/26/21 1830     Visit Number 4    Number of Visits 8    Authorization Type [employer]    Authorization Time Period Foto 6th and 10th visit    PT Start Time 1831    PT Stop Time 1913    PT Time Calculation (min) 42 min    Activity Tolerance Patient tolerated treatment well    Behavior During Therapy WFL for tasks assessed/performed             Past Medical History:  Diagnosis Date   ADHD    Anemia    Anxiety    Back pain    Chronic headache    Current smoker    Depression    Fatty liver    GERD (gastroesophageal reflux disease)    Hypertension    IBS (irritable bowel syndrome)    Obesity    Other fatigue    Shortness of breath on exertion    Urticaria    Vitamin D deficiency     History reviewed. No pertinent surgical history.  There were no vitals filed for this visit.   Subjective Assessment - 02/26/21 1831     Subjective Patient reports moderate compliance with HEP.    Patient Stated Goals be able to go on walks and grocery shopping without pain or needing a cart    Pain Score 0-No pain   Max=3/10 since last visit   Pain Location Back    Pain Orientation Left    Pain Descriptors / Indicators Burning                [facility] PT Assessment - 02/26/21 0001       Assessment   Medical Diagnosis Midline Low Back Pain    Referring Provider (PT) Inda Coke, PA                           [facility] Adult PT Treatment/Exercise - 02/26/21 0001       Lumbar Exercises: Stretches   Gastroc Stretch --    Gastroc Stretch Limitations --      Lumbar Exercises: Aerobic   Nustep L6x58min B UE/LE =8       Lumbar Exercises: Standing   Heel Raises 20 reps    Other Standing Lumbar Exercises Pallof Press BLUE 2x10 ea dir    Other Standing Lumbar Exercises Attention to pelvic alignment      Lumbar Exercises: Supine   Pelvic Tilt 1 rep    Pelvic Tilt Limitations uncomfortable    Bridge with Ball Squeeze 10 reps    Bridge with clamshell 10 reps      Lumbar Exercises: Sidelying   Clam 10 reps;Right;Left    Clam Limitations BLUE    Hip Abduction Right;Left;10 reps    Other Sidelying Lumbar Exercises Hip add x10      Knee/Hip Exercises: Stretches   Press photographer 30 seconds;2 reps    Gastroc Stretch Limitations slant board    Soleus Stretch 30 seconds;2 reps    Soleus Stretch Limitations slant board      Knee/Hip Exercises: Standing   Rocker Board 1 minute  Rocker Board Limitations R/L, FWD/BK                    PT Education - 02/26/21 1856     Education Details Additions for HEP for hip strength. Discontinue pelvic tilt due to discomfort.    Person(s) Educated Patient    Methods Explanation;Demonstration;Handout    Comprehension Verbalized understanding;Returned demonstration              PT Short Term Goals - 02/12/21 1853       PT SHORT TERM GOAL #1   Title Pt will be I and compliant with initial HEP.    Baseline provided at eval    Time 3    Period Weeks    Status Achieved               PT Long Term Goals - 01/20/21 1959       PT LONG TERM GOAL #1   Title Pt will be independent with advanced HEP for management of symptoms and long term maintenance for any potential reoccurence.    Time 8    Period Weeks    Status New    Target Date 03/17/21      PT LONG TERM GOAL #2   Title Pt will demonstrate TrA activation in various positions, able to hold contraction in hooklying for at least 30 seconds while breathing or talking.    Baseline flickering in H/L, able to hold for one exhale    Time 8    Period Weeks    Status New    Target Date  03/17/21      PT LONG TERM GOAL #3   Title Pt will demonstrate neutral lumbar and pelvic alignment without knees locking in standing.    Baseline increase lumbar lordosis and anterior pelvic tilt, as well as locking knees    Time 8    Period Weeks    Status New    Target Date 03/17/21      PT LONG TERM GOAL #4   Title increase FOTO ability to at least 65% ability, in order to demonstrate meaningful change in perceived level of functional ability.    Baseline 56% ability    Time 8    Period Weeks    Status New    Target Date 03/17/21                   Plan - 02/26/21 1902     Clinical Impression Statement Patient contiues to progress with decreased pain.  Patient continues to be challenge by decreased hip strength and core stability as well as tight hamstrings and calves.  Patient will benefit from continued skilled PT to address deficits and maximize daily tasks without pain.    Comorbidities HTN, anemia    Examination-Activity Limitations Lift;Squat;Locomotion Level;Stairs;Stand    Examination-Participation Restrictions Shop;Community Activity;Occupation;Cleaning    PT Treatment/Interventions ADLs/Self Care Home Management;Iontophoresis 4mg /ml Dexamethasone;Electrical Stimulation;Cryotherapy;Moist Heat;Gait training;Stair training;Functional mobility training;Therapeutic activities;Aquatic Therapy;Therapeutic exercise;Balance training;Neuromuscular re-education;Patient/family education;Manual techniques;Passive range of motion;Dry needling;Taping;Spinal Manipulations;Joint Manipulations    PT Next Visit Plan Assess response to HEP/update PRN, assess spinal mobility further pelvic alignment, continue core stabilization    PT Home Exercise Plan [email or web address]    Consulted and Agree with Plan of Care Patient             Patient will benefit from skilled therapeutic intervention in order to improve the following deficits and impairments:  Difficulty walking, Hypermobility,  Increased fascial restricitons, Postural dysfunction,  Pain, Obesity, Improper body mechanics, Impaired perceived functional ability, Decreased activity tolerance, Decreased strength  Visit Diagnosis: Abnormal posture  Muscle weakness (generalized)  Chronic right-sided low back pain with right-sided sciatica     Problem List Patient Active Problem List   Diagnosis Date Noted   Insulin resistance 10/22/2020   Anemia    IUD (intrauterine device) in place 06/13/2019   Hypertriglyceridemia 06/13/2019   Morbid obesity with BMI of 45.0-49.9, adult ([place]) 08/09/2018   Lactose intolerance 08/09/2018   Vitamin D deficiency 08/09/2018   History of pituitary adenoma ([place]) 05/20/2017   ADD (attention deficit disorder), Rx Adderall 05/20/2017   Current smoker 02/27/2017   Depression 02/25/2017   Anxiety 02/25/2017    Pollyann Samples, PT 02/26/2021, 7:26 PM  [facility] [address] [place], [place], [address] Phone: [phone]   Fax:  [phone]  Name: Bernadene Garside MRN: [id number] Date of Birth: 1996-05-29

## 2021-02-26 NOTE — Patient Instructions (Signed)
Access Code: [email or web address] URL: [email or web address] Date: 02/26/2021 Prepared by: Pollyann Samples  Program Notes All exercises with abdominal setting.  NEW Exercises DISCHARGE - 2 x daily - 7 x weekly - 2 sets - 10 reps  Sidelying Hip Adduction - 1 x daily - 7 x weekly - 2 sets - 10 reps Prone Hip Extension - 1 x daily - 7 x weekly - 2 sets - 10 reps Straight Leg Raise - 1 x daily - 7 x weekly - 2 sets - 10 reps

## 2021-03-02 ENCOUNTER — Ambulatory Visit (INDEPENDENT_AMBULATORY_CARE_PROVIDER_SITE_OTHER): Payer: Managed Care, Other (non HMO) | Admitting: Family Medicine

## 2021-03-02 ENCOUNTER — Encounter (INDEPENDENT_AMBULATORY_CARE_PROVIDER_SITE_OTHER): Payer: Self-pay | Admitting: Family Medicine

## 2021-03-02 ENCOUNTER — Other Ambulatory Visit: Payer: Self-pay

## 2021-03-02 VITALS — BP 116/76 | HR 80 | Temp 97.8°F | Ht 60.0 in | Wt 303.0 lb

## 2021-03-02 DIAGNOSIS — Z9189 Other specified personal risk factors, not elsewhere classified: Secondary | ICD-10-CM

## 2021-03-02 DIAGNOSIS — E559 Vitamin D deficiency, unspecified: Secondary | ICD-10-CM | POA: Diagnosis not present

## 2021-03-02 DIAGNOSIS — Z6841 Body Mass Index (BMI) 40.0 and over, adult: Secondary | ICD-10-CM

## 2021-03-02 DIAGNOSIS — K760 Fatty (change of) liver, not elsewhere classified: Secondary | ICD-10-CM

## 2021-03-05 ENCOUNTER — Other Ambulatory Visit: Payer: Self-pay

## 2021-03-05 ENCOUNTER — Encounter: Payer: Self-pay | Admitting: Physical Therapy

## 2021-03-05 ENCOUNTER — Ambulatory Visit: Payer: Managed Care, Other (non HMO) | Admitting: Physical Therapy

## 2021-03-05 DIAGNOSIS — M6281 Muscle weakness (generalized): Secondary | ICD-10-CM

## 2021-03-05 DIAGNOSIS — G8929 Other chronic pain: Secondary | ICD-10-CM

## 2021-03-05 DIAGNOSIS — R293 Abnormal posture: Secondary | ICD-10-CM

## 2021-03-05 NOTE — Therapy (Signed)
[facility] [place], [place], [address] Phone: [phone]   Fax:  [phone]  Physical Therapy Treatment/DISCHARGE SUMMARY  Patient Details  Name: Veronica Gray MRN: [id number] Date of Birth: Jul 09, 1996 Referring Provider (PT): Inda Coke, [place]   Encounter Date: 03/05/2021   PT End of Session - 03/05/21 1857     Visit Number 5    Number of Visits 8    Date for PT Re-Evaluation 03/17/21    Authorization Type [employer]    PT Start Time 1831    PT Stop Time 1900    PT Time Calculation (min) 29 min    Activity Tolerance Patient tolerated treatment well    Behavior During Therapy [facility] for tasks assessed/performed             Past Medical History:  Diagnosis Date   ADHD    Anemia    Anxiety    Back pain    Chronic headache    Current smoker    Depression    Fatty liver    GERD (gastroesophageal reflux disease)    Hypertension    IBS (irritable bowel syndrome)    Obesity    Other fatigue    Shortness of breath on exertion    Urticaria    Vitamin D deficiency     History reviewed. No pertinent surgical history.  There were no vitals filed for this visit.   Subjective Assessment - 03/05/21 1830     Subjective Patient reports improved compliance with HEP. Walking 30-45 min the other day with only a little pain.    Limitations Standing;Walking;Lifting;House hold activities    Patient Stated Goals be able to go on walks and grocery shopping without pain or needing a cart    Pain Score 0-No pain    Pain Location Back                [facility] PT Assessment - 03/05/21 0001       Assessment   Medical Diagnosis Midline Low Back Pain    Referring Provider (PT) Inda Coke, PA      Observation/Other Assessments   Observations hypermobility in joints (knee and elbow hyperextension), anterior pelvic tilt in standing    Focus on Therapeutic Outcomes (FOTO)  82% ability, 65% ability predicted       AROM   Lumbar Flexion 75    Lumbar Extension 50    Lumbar - Right Side Bend 40    Lumbar - Left Side Bend 40    Lumbar - Right Rotation WFL    Lumbar - Left Rotation WFL      Flexibility   Hamstrings R=60 L=67      Palpation   Palpation comment Min TTP at L4 PA, none in musculature                           [facility] Adult PT Treatment/Exercise - 03/05/21 0001       Self-Care   Self-Care Posture;Heat/Ice Application;Other Self-Care Comments   Keep HEP safe even if you have it memorized for possible future use to catch any return of pain before it escallates.                   PT Education - 03/05/21 1857     Education Details Keep HEP for future use, ice could be used if irritated, heat to relax muscles.    Person(s) Educated Patient  Methods Explanation;Demonstration;Verbal cues    Comprehension Verbalized understanding;Returned demonstration              PT Short Term Goals - 03/05/21 1834       PT SHORT TERM GOAL #1   Title Pt will be I and compliant with initial HEP.    Status Achieved               PT Long Term Goals - 03/05/21 1835       PT LONG TERM GOAL #1   Title Pt will be independent with advanced HEP for management of symptoms and long term maintenance for any potential reoccurence.    Status Achieved      PT LONG TERM GOAL #2   Title Pt will demonstrate TrA activation in various positions, able to hold contraction in hooklying for at least 30 seconds while breathing or talking.    Status Achieved      PT LONG TERM GOAL #3   Title Pt will demonstrate neutral lumbar and pelvic alignment without knees locking in standing.    Baseline increase lumbar lordosis and anterior pelvic tilt, as well as locking knees    Status Achieved      PT LONG TERM GOAL #4   Title increase FOTO ability to at least 65% ability, in order to demonstrate meaningful change in perceived level of functional ability.    Baseline 56%  ability; 03/05/2021 DC=82% ability    Status Achieved                   Plan - 03/05/21 1858     Clinical Impression Statement Patient presented to PT treatment with no pain, good HEP compliance.  Patient satisfied with progress and agreable to discharge today.  Patient has met all short term and long term goals.  Reviewed HEP and management measures should back pain return.  Patient will be discharged to independent HEP and care of physician.    Comorbidities HTN, anemia    Examination-Activity Limitations Lift;Squat;Locomotion Level;Stairs;Stand    PT Next Visit Plan Discharge today    Consulted and Agree with Plan of Care Patient             Patient will benefit from skilled therapeutic intervention in order to improve the following deficits and impairments:     Visit Diagnosis: Abnormal posture  Muscle weakness (generalized)  Chronic right-sided low back pain with right-sided sciatica     Problem List Patient Active Problem List   Diagnosis Date Noted   Insulin resistance 10/22/2020   Anemia    IUD (intrauterine device) in place 06/13/2019   Hypertriglyceridemia 06/13/2019   Morbid obesity with BMI of 45.0-49.9, adult ([place]) 08/09/2018   Lactose intolerance 08/09/2018   Vitamin D deficiency 08/09/2018   History of pituitary adenoma ([place]) 05/20/2017   ADD (attention deficit disorder), Rx Adderall 05/20/2017   Current smoker 02/27/2017   Depression 02/25/2017   Anxiety 02/25/2017    Pollyann Samples 03/05/2021, 7:04 PM  [facility] [address] [place], [place], [address] Phone: [phone]   Fax:  [phone]  Name: Veronica Gray MRN: [id number] Date of Birth: [birth year]   PHYSICAL THERAPY DISCHARGE SUMMARY  Visits from Start of Care: 5  Current functional level related to goals / functional outcomes: Met all functional goals   Remaining deficits: Very mild pain on palpation at L4 region    Education / Equipment: HEP   Patient agrees to discharge. Patient goals  were met. Patient is being discharged due to meeting the stated rehab goals.   Pollyann Samples, PT

## 2021-03-05 NOTE — Progress Notes (Signed)
Chief Complaint:   OBESITY Veronica Gray is here to discuss her progress with her obesity treatment plan along with follow-up of her obesity related diagnoses. Veronica Gray is on the Category 4 Plan and states she is following her eating plan approximately 85% of the time. Veronica Gray states she is doing PT and walking dogs 45-60 minutes 2-7 times per week.  Today's visit was #: 4 Starting weight: 309 lbs Starting date: 01/15/2021 Today's weight: 303 lbs Today's date: 03/02/2021 Total lbs lost to date: 6 Total lbs lost since last in-office visit: 0  Interim History: Veronica Gray spent the 4th at a friends house. She had a few meals on the meal plan but some off. In the next few weeks she is going back to the office. Additions/substitutions from previous appt is helpful.  Subjective:   1. Vitamin D deficiency Pt denies nausea, vomiting, and muscle weakness but notes fatigue. Pt is on prescription Vit D. Her last Vit D level was 21.5.  2. NAFLD (nonalcoholic fatty liver disease) Pt's last transaminases was slightly elevated.  3. At risk for osteoporosis Veronica Gray is at higher risk of osteopenia and osteoporosis due to Vitamin D deficiency.   Assessment/Plan:   1. Vitamin D deficiency Low Vitamin D level contributes to fatigue and are associated with obesity, breast, and colon cancer. She agrees to continue to take prescription Vitamin D @50 ,000 IU every week and will follow-up for routine testing of Vitamin D, at least 2-3 times per year to avoid over-replacement.  2. NAFLD (nonalcoholic fatty liver disease) We discussed the likely diagnosis of non-alcoholic fatty liver disease today and how this condition is obesity related. Veronica Gray was educated the importance of weight loss. Veronica Gray agreed to continue with her weight loss efforts with healthier diet and exercise as an essential part of her treatment plan. Repeat labs in September.  3. At risk for osteoporosis Veronica Gray was given approximately 15  minutes of osteoporosis prevention counseling today. Veronica Gray is at risk for osteopenia and osteoporosis due to her Vitamin D deficiency. She was encouraged to take her Vitamin D and follow her higher calcium diet and increase strengthening exercise to help strengthen her bones and decrease her risk of osteopenia and osteoporosis.  Repetitive spaced learning was employed today to elicit superior memory formation and behavioral change.  4. Class 3 severe obesity with serious comorbidity and body mass index (BMI) of 50.0 to 59.9 in adult, unspecified obesity type (HCC)  Veronica Gray is currently in the action stage of change. As such, her goal is to continue with weight loss efforts. She has agreed to the Category 4 Plan.   Exercise goals: No exercise has been prescribed at this time.  Behavioral modification strategies: increasing lean protein intake, meal planning and cooking strategies, keeping healthy foods in the home, and planning for success.  Veronica Gray has agreed to follow-up with our clinic in 2-3 weeks. She was informed of the importance of frequent follow-up visits to maximize her success with intensive lifestyle modifications for her multiple health conditions.   Objective:   Blood pressure 116/76, pulse 80, temperature 97.8 F (36.6 C), height 5' (1.524 m), weight (!) 303 lb (137.4 kg), SpO2 97 %. Body mass index is 59.18 kg/m.  General: Cooperative, alert, well developed, in no acute distress. HEENT: Conjunctivae and lids unremarkable. Cardiovascular: Regular rhythm.  Lungs: Normal work of breathing. Neurologic: No focal deficits.   Lab Results  Component Value Date   CREATININE 0.62 10/21/2020   BUN 11 10/21/2020  NA 136 10/21/2020   K 4.0 10/21/2020   CL 99 10/21/2020   CO2 27 10/21/2020   Lab Results  Component Value Date   ALT 53 (H) 12/25/2020   AST 43 (H) 12/25/2020   ALKPHOS 80 12/25/2020   BILITOT 0.3 12/25/2020   Lab Results  Component Value Date   HGBA1C  5.7 10/21/2020   HGBA1C 5.0 (A) 07/13/2019   HGBA1C 5.6 03/16/2019   Lab Results  Component Value Date   INSULIN 15.5 01/15/2021   Lab Results  Component Value Date   TSH 2.35 11/20/2020   Lab Results  Component Value Date   CHOL 164 01/15/2021   HDL 45 01/15/2021   LDLCALC 97 01/15/2021   LDLDIRECT 90.0 07/13/2019   TRIG 121 01/15/2021   CHOLHDL 4 07/13/2019   Lab Results  Component Value Date   VD25OH 21.5 (L) 01/15/2021   Lab Results  Component Value Date   WBC 9.1 10/21/2020   HGB 12.3 10/21/2020   HCT 37.1 10/21/2020   MCV 85.0 10/21/2020   PLT 328.0 10/21/2020   Lab Results  Component Value Date   FERRITIN 54.6 12/25/2020    Attestation Statements:   Reviewed by clinician on day of visit: allergies, medications, problem list, medical history, surgical history, family history, social history, and previous encounter notes.  Coral Ceo, CMA, am acting as transcriptionist for Coralie Common, MD.   I have reviewed the above documentation for accuracy and completeness, and I agree with the above. - Coralie Common, MD

## 2021-03-12 ENCOUNTER — Ambulatory Visit: Payer: Managed Care, Other (non HMO) | Admitting: Obstetrics and Gynecology

## 2021-03-12 ENCOUNTER — Encounter: Payer: Managed Care, Other (non HMO) | Admitting: Physical Therapy

## 2021-03-16 ENCOUNTER — Encounter (INDEPENDENT_AMBULATORY_CARE_PROVIDER_SITE_OTHER): Payer: Self-pay | Admitting: Family Medicine

## 2021-03-16 ENCOUNTER — Other Ambulatory Visit: Payer: Self-pay

## 2021-03-16 ENCOUNTER — Other Ambulatory Visit (INDEPENDENT_AMBULATORY_CARE_PROVIDER_SITE_OTHER): Payer: Self-pay | Admitting: Family Medicine

## 2021-03-16 ENCOUNTER — Ambulatory Visit (INDEPENDENT_AMBULATORY_CARE_PROVIDER_SITE_OTHER): Payer: Managed Care, Other (non HMO) | Admitting: Family Medicine

## 2021-03-16 VITALS — BP 111/73 | HR 92 | Temp 97.6°F | Ht 60.0 in | Wt 298.0 lb

## 2021-03-16 DIAGNOSIS — R7303 Prediabetes: Secondary | ICD-10-CM

## 2021-03-16 DIAGNOSIS — Z9189 Other specified personal risk factors, not elsewhere classified: Secondary | ICD-10-CM

## 2021-03-16 DIAGNOSIS — Z6841 Body Mass Index (BMI) 40.0 and over, adult: Secondary | ICD-10-CM | POA: Diagnosis not present

## 2021-03-16 DIAGNOSIS — E559 Vitamin D deficiency, unspecified: Secondary | ICD-10-CM

## 2021-03-16 MED ORDER — VITAMIN D (ERGOCALCIFEROL) 1.25 MG (50000 UNIT) PO CAPS: 50000.0000 [IU] | ORAL_CAPSULE | ORAL | 0 refills | Status: DC

## 2021-03-18 NOTE — Progress Notes (Signed)
Chief Complaint:   OBESITY Veronica Gray is here to discuss her progress with her obesity treatment plan along with follow-up of her obesity related diagnoses. Veronica Gray is on the Category 4 Plan and states she is following her eating plan approximately 85% of the time. Veronica Gray states she is walking 20-30 minutes 3 times per week.  Today's visit was #: 5 Starting weight: 309 lbs Starting date: 01/15/2021 Today's weight: 298 lbs Today's date: 03/16/2021 Total lbs lost to date: 11 Total lbs lost since last in-office visit: 5  Interim History: Veronica Gray is back in the office and voices there have been many indulgent sweets and treats in the office. She is working toward all food on meal plan. Breakfast and dinner tend to be the hardest. She is occasionally making protein substitutions. She is doing Cheez-Its or peanut butter crackers for snacks.  Subjective:   1. Vitamin D deficiency Veronica Gray denies nausea, vomiting, and muscle weakness but notes fatigue. Pt is on prescription Vit D. Her last Vit D level was 21.5.  2. Pre-diabetes Her last A1c was 5.7. She is not on medication.  3. At risk for osteoporosis Veronica Gray is at higher risk of osteopenia and osteoporosis due to Vitamin D deficiency.   Assessment/Plan:   1. Vitamin D deficiency Low Vitamin D level contributes to fatigue and are associated with obesity, breast, and colon cancer. She agrees to continue to take prescription Vitamin D '@50'$ ,000 IU every week and will follow-up for routine testing of Vitamin D, at least 2-3 times per year to avoid over-replacement.  Refill- Vitamin D, Ergocalciferol, (DRISDOL) 1.25 MG (50000 UNIT) CAPS capsule; Take 1 capsule (50,000 Units total) by mouth every 7 (seven) days.  Dispense: 4 capsule; Refill: 0  2. Pre-diabetes Veronica Gray will continue to work on weight loss, exercise, and decreasing simple carbohydrates to help decrease the risk of diabetes. Repeat labs in October.  3. At risk for  osteoporosis Veronica Gray was given approximately 15 minutes of osteoporosis prevention counseling today. Veronica Gray is at risk for osteopenia and osteoporosis due to her Vitamin D deficiency. She was encouraged to take her Vitamin D and follow her higher calcium diet and increase strengthening exercise to help strengthen her bones and decrease her risk of osteopenia and osteoporosis.  Repetitive spaced learning was employed today to elicit superior memory formation and behavioral change.  4. Obesity with current BMI of 60.35  Veronica Gray is currently in the action stage of change. As such, her goal is to continue with weight loss efforts. She has agreed to the Category 4 Plan.   Exercise goals: All adults should avoid inactivity. Some physical activity is better than none, and adults who participate in any amount of physical activity gain some health benefits.  Behavioral modification strategies: increasing lean protein intake, meal planning and cooking strategies, and keeping healthy foods in the home.  Veronica Gray has agreed to follow-up with our clinic in 3 weeks. She was informed of the importance of frequent follow-up visits to maximize her success with intensive lifestyle modifications for her multiple health conditions.   Objective:   Blood pressure 111/73, pulse 92, temperature 97.6 F (36.4 C), height 5' (1.524 m), weight 298 lb (135.2 kg), SpO2 97 %. Body mass index is 58.2 kg/m.  General: Cooperative, alert, well developed, in no acute distress. HEENT: Conjunctivae and lids unremarkable. Cardiovascular: Regular rhythm.  Lungs: Normal work of breathing. Neurologic: No focal deficits.   Lab Results  Component Value Date   CREATININE 0.62 10/21/2020  BUN 11 10/21/2020   NA 136 10/21/2020   K 4.0 10/21/2020   CL 99 10/21/2020   CO2 27 10/21/2020   Lab Results  Component Value Date   ALT 53 (H) 12/25/2020   AST 43 (H) 12/25/2020   ALKPHOS 80 12/25/2020   BILITOT 0.3 12/25/2020    Lab Results  Component Value Date   HGBA1C 5.7 10/21/2020   HGBA1C 5.0 (A) 07/13/2019   HGBA1C 5.6 03/16/2019   Lab Results  Component Value Date   INSULIN 15.5 01/15/2021   Lab Results  Component Value Date   TSH 2.35 11/20/2020   Lab Results  Component Value Date   CHOL 164 01/15/2021   HDL 45 01/15/2021   LDLCALC 97 01/15/2021   LDLDIRECT 90.0 07/13/2019   TRIG 121 01/15/2021   CHOLHDL 4 07/13/2019   Lab Results  Component Value Date   VD25OH 21.5 (L) 01/15/2021   Lab Results  Component Value Date   WBC 9.1 10/21/2020   HGB 12.3 10/21/2020   HCT 37.1 10/21/2020   MCV 85.0 10/21/2020   PLT 328.0 10/21/2020   Lab Results  Component Value Date   FERRITIN 54.6 12/25/2020    Attestation Statements:   Reviewed by clinician on day of visit: allergies, medications, problem list, medical history, surgical history, family history, social history, and previous encounter notes.  Coral Ceo, CMA, am acting as transcriptionist for Coralie Common, MD.   I have reviewed the above documentation for accuracy and completeness, and I agree with the above. - Coralie Common, MD

## 2021-03-30 ENCOUNTER — Other Ambulatory Visit: Payer: Self-pay | Admitting: Physician Assistant

## 2021-04-06 ENCOUNTER — Ambulatory Visit (INDEPENDENT_AMBULATORY_CARE_PROVIDER_SITE_OTHER): Payer: Managed Care, Other (non HMO) | Admitting: Family Medicine

## 2021-04-06 ENCOUNTER — Other Ambulatory Visit (INDEPENDENT_AMBULATORY_CARE_PROVIDER_SITE_OTHER): Payer: Self-pay | Admitting: Family Medicine

## 2021-04-06 ENCOUNTER — Encounter (INDEPENDENT_AMBULATORY_CARE_PROVIDER_SITE_OTHER): Payer: Self-pay | Admitting: Family Medicine

## 2021-04-06 ENCOUNTER — Other Ambulatory Visit: Payer: Self-pay

## 2021-04-06 VITALS — BP 134/82 | HR 95 | Temp 97.6°F | Ht 64.0 in | Wt 295.0 lb

## 2021-04-06 DIAGNOSIS — F419 Anxiety disorder, unspecified: Secondary | ICD-10-CM

## 2021-04-06 DIAGNOSIS — Z6841 Body Mass Index (BMI) 40.0 and over, adult: Secondary | ICD-10-CM

## 2021-04-06 DIAGNOSIS — F32A Depression, unspecified: Secondary | ICD-10-CM

## 2021-04-06 DIAGNOSIS — E559 Vitamin D deficiency, unspecified: Secondary | ICD-10-CM

## 2021-04-06 NOTE — Progress Notes (Signed)
25 y.o. G0P0000 Married White or Caucasian Not Hispanic or Latino female here for annual exam.  She has a mirena IUD, she thinks it was last 2016, early 2017. She would like to have the mirena out, thinking about getting pregnant in 3 years. She is interested in the [place].  No dyspareunia.  Period Cycle (Days):  (Occas period with Mirena) Period Duration (Days): 3 Period Pattern: (!) Irregular Menstrual Flow: Light Dysmenorrhea: (!) Mild  She has a h/o a pituitary microadenoma, previously followed by Endocrinology and was on parlodel. She had f/u normal brain MRI in 10/20  She has been diagnosed with fatty liver and prediabetes. She has lost 20-25 lbs in the last 3 months, going to healthy weight and wellness. She is doing light exercise.   She has IBS-D. On medication for it.  She was having some mild GSI, seem to be gone.   No LMP recorded. (Menstrual status: IUD).          Sexually active: Yes.    The current method of family planning is Mirena    Exercising: Yes.     Smoker:  no  Health Maintenance: Pap:  05-30-18 normal History of abnormal Pap:  no MMG:  N/A BMD:   N/A Colonoscopy: N/A TDaP:  UTD Gardasil: completed   reports that she quit smoking about 2 years ago. Her smoking use included cigarettes. She started smoking about 9 years ago. She has a 6.00 pack-year smoking history. She has never used smokeless tobacco. She reports current alcohol use. She reports current drug use. Drug: Marijuana. Just occasional ETOH. Works at [employer] job.   Past Medical History:  Diagnosis Date   ADHD    Anemia    Anxiety    Back pain    Chronic headache    Current smoker    Depression    Fatty liver    GERD (gastroesophageal reflux disease)    Hypertension    IBS (irritable bowel syndrome)    Obesity    Other fatigue    Shortness of breath on exertion    Urticaria    Vitamin D deficiency     History reviewed. No pertinent surgical history.  Current Outpatient  Medications  Medication Sig Dispense Refill   amphetamine-dextroamphetamine (ADDERALL XR) 10 MG 24 hr capsule Take 10 mg by mouth daily.     ARIPiprazole (ABILIFY) 2 MG tablet TAKE 1 TABLET (2 MG) BY MOUTH DAILY 90 tablet 2   aspirin-acetaminophen-caffeine (EXCEDRIN MIGRAINE) 250-250-65 MG tablet Take 1 tablet by mouth as needed for headache.     calcium carbonate (TUMS EX) 750 MG chewable tablet Chew 1 tablet by mouth as needed for heartburn.     cetirizine (ZYRTEC) 10 MG tablet Take 1 tablet (10 mg total) by mouth as needed for allergies. Take 1 tablet by mouth daily as needed for seasonal and/or animal related allergies. 30 tablet 5   diphenhydrAMINE (SOMINEX) 25 MG tablet Take 25 mg by mouth as needed for sleep. Take 1 tablet by mouth as needed for seasonal allergies and/or animal related allergies.     EPINEPHrine 0.3 mg/0.3 mL IJ SOAJ injection SMARTSIG:1 Pre-Filled Pen Syringe IM PRN     famotidine (PEPCID) 20 MG tablet Take 1 tablet (20 mg total) by mouth 2 (two) times daily. 60 tablet 5   FLUoxetine (PROZAC) 10 MG capsule Take 20 capsules by mouth daily.     glycopyrrolate (ROBINUL) 2 MG tablet TAKE 1 TABLET(2 MG) BY MOUTH DAILY WITH BREAKFAST  30 tablet 3   levonorgestrel (MIRENA) 20 MCG/24HR IUD 1 each by Intrauterine route once.     melatonin 3 MG TABS tablet Take 1.5 mg by mouth at bedtime. Take 1/2 tablet (1.'5mg'$ ) total by mouth at bedtime as needed for sleep.     Vitamin D, Ergocalciferol, (DRISDOL) 1.25 MG (50000 UNIT) CAPS capsule Take 1 capsule (50,000 Units total) by mouth every 7 (seven) days. 4 capsule 0   Wheat Dextrin (BENEFIBER DRINK MIX) PACK Take 1 each by mouth daily.     No current facility-administered medications for this visit.    Family History  Problem Relation Age of Onset   Hypertrophic cardiomyopathy Mother    Hypertension Mother    Heart disease Mother    Depression Mother    Eating disorder Mother    Obesity Mother    Hyperlipidemia Father     Hypertension Father    Colon polyps Father    Anxiety disorder Father    Alcoholism Father    Mental illness Maternal Grandmother    Colon polyps Maternal Grandmother    Stroke Maternal Grandfather    Hyperlipidemia Paternal Grandmother    Cancer Paternal Grandfather        stomach   Depression Other    Anxiety disorder Other    Alcoholism Other    Eating disorder Other    Obesity Other    Other Neg Hx        galactorrhea   Colon cancer Neg Hx    Esophageal cancer Neg Hx     Review of Systems  All other systems reviewed and are negative.  Exam:   BP 118/74 (Cuff Size: Large)   Pulse 91   Ht '5\' 4"'$  (1.626 m)   Wt (!) 301 lb (136.5 kg)   SpO2 99%   BMI 51.67 kg/m   Weight change: '@WEIGHTCHANGE'$ @ Height:   Height: '5\' 4"'$  (162.6 cm)  Ht Readings from Last 3 Encounters:  04/10/21 '5\' 4"'$  (1.626 m)  04/06/21 '5\' 4"'$  (1.626 m)  03/16/21 5' (1.524 m)    General appearance: alert, cooperative and appears stated age Head: Normocephalic, without obvious abnormality, atraumatic Neck: no adenopathy, supple, symmetrical, trachea midline and thyroid normal to inspection and palpation Lungs: clear to auscultation bilaterally Cardiovascular: regular rate and rhythm Breasts: normal appearance, no masses or tenderness Abdomen: soft, non-tender; non distended,  no masses,  no organomegaly Extremities: extremities normal, atraumatic, no cyanosis or edema Skin: Skin color, texture, turgor normal. No rashes or lesions Lymph nodes: Cervical, supraclavicular, and axillary nodes normal. No abnormal inguinal nodes palpated Neurologic: Grossly normal   Pelvic: External genitalia:  no lesions              Urethra:  normal appearing urethra with no masses, tenderness or lesions              Bartholins and Skenes: normal                 Vagina: normal appearing vagina with normal color and discharge, no lesions              Cervix: no lesions and IUD string 2 cm               Bimanual Exam:   Uterus:   no masses or tenderness, limited by BMI              Adnexa: no mass, fullness, tenderness  Rectovaginal: Confirms               Anus:  normal sphincter tone, no lesions  Caryn Bee, CMA chaperoned for the exam.  1. Well woman exam Discussed breast self exam Discussed calcium and vit D intake Screening labs with primary  2. Screening for cervical cancer - Cytology - PAP  3. History of pituitary adenoma (HCC) Last imaging was negative for adenoma - Prolactin  4. General counseling and advice on female contraception Desires IUD removal and placement of nexplanon, discussed nexplanon, information given - IUD removal; Future - Insertion of implanon rod; Future -She is aware that she will need back up contraception for one week after nexplanon insertion/IUD removal

## 2021-04-08 NOTE — Progress Notes (Signed)
Chief Complaint:   OBESITY Veronica Gray is here to discuss her progress with her obesity treatment plan along with follow-up of her obesity related diagnoses. [name] is on the Category 4 Plan and states she is following her eating plan approximately 90% of the time. [name] states she is using her E-bike 30 minutes 3-4 times per week.  Today's visit was #: 6 Starting weight: 309 lbs Starting date: 01/15/2021 Today's weight: 295 lbs Today's date: 04/06/2021 Total lbs lost to date: 14 Total lbs lost since last in-office visit: 3  Interim History: [name] has an E-bike since her last appt, which she has been riding to work. She has been doing a mix of category 4 and recipes from meal plan. She hasn't been consistently compliant with food journal. She likes the mix of category 4 and recipes. Her birthday is coming up in September.  Subjective:   1. Anxiety and depression Prozac was increased to 40 mg daily due to increased anxiety attacks. Pt denies suicidal or homicidal ideations.  Assessment/Plan:   1. Anxiety and depression Behavior modification techniques were discussed today to help [name] deal with her anxiety.  Orders and follow up as documented in patient record. Follow up with psychiatrist as needed.  2. Obesity with current BMI of 50.7  [name] is currently in the action stage of change. As such, her goal is to continue with weight loss efforts. She has agreed to the Category 4 Plan and keeping a food journal and adhering to recommended goals of 1650-1800 calories and 125+ grams protein.   Exercise goals:  Add 10 minutes 3 times a week of resistance training.  Behavioral modification strategies: increasing lean protein intake, meal planning and cooking strategies, keeping healthy foods in the home, planning for success, and keeping a strict food journal.  [name] has agreed to follow-up with our clinic in 3 weeks. She was informed of the importance of frequent follow-up  visits to maximize her success with intensive lifestyle modifications for her multiple health conditions.   Objective:   Blood pressure 134/82, pulse 95, temperature 97.6 F (36.4 C), height '5\' 4"'$  (1.626 m), weight 295 lb (133.8 kg), SpO2 97 %. Body mass index is 50.64 kg/m.  General: Cooperative, alert, well developed, in no acute distress. HEENT: Conjunctivae and lids unremarkable. Cardiovascular: Regular rhythm.  Lungs: Normal work of breathing. Neurologic: No focal deficits.   Lab Results  Component Value Date   CREATININE 0.62 10/21/2020   BUN 11 10/21/2020   NA 136 10/21/2020   K 4.0 10/21/2020   CL 99 10/21/2020   CO2 27 10/21/2020   Lab Results  Component Value Date   ALT 53 (H) 12/25/2020   AST 43 (H) 12/25/2020   ALKPHOS 80 12/25/2020   BILITOT 0.3 12/25/2020   Lab Results  Component Value Date   HGBA1C 5.7 10/21/2020   HGBA1C 5.0 (A) 07/13/2019   HGBA1C 5.6 03/16/2019   Lab Results  Component Value Date   INSULIN 15.5 01/15/2021   Lab Results  Component Value Date   TSH 2.35 11/20/2020   Lab Results  Component Value Date   CHOL 164 01/15/2021   HDL 45 01/15/2021   LDLCALC 97 01/15/2021   LDLDIRECT 90.0 07/13/2019   TRIG 121 01/15/2021   CHOLHDL 4 07/13/2019   Lab Results  Component Value Date   VD25OH 21.5 (L) 01/15/2021   Lab Results  Component Value Date   WBC 9.1 10/21/2020   HGB 12.3 10/21/2020   HCT  37.1 10/21/2020   MCV 85.0 10/21/2020   PLT 328.0 10/21/2020   Lab Results  Component Value Date   FERRITIN 54.6 12/25/2020    Attestation Statements:   Reviewed by clinician on day of visit: allergies, medications, problem list, medical history, surgical history, family history, social history, and previous encounter notes.  Time spent on visit including pre-visit chart review and post-visit care and charting was 15 minutes.   Coral Ceo, CMA, am acting as transcriptionist for Coralie Common, MD.   I have reviewed  the above documentation for accuracy and completeness, and I agree with the above. - Coralie Common, MD

## 2021-04-10 ENCOUNTER — Ambulatory Visit: Payer: Managed Care, Other (non HMO) | Admitting: Obstetrics and Gynecology

## 2021-04-10 ENCOUNTER — Encounter: Payer: Self-pay | Admitting: Obstetrics and Gynecology

## 2021-04-10 ENCOUNTER — Other Ambulatory Visit (HOSPITAL_COMMUNITY)
Admission: RE | Admit: 2021-04-10 | Discharge: 2021-04-10 | Disposition: A | Discharge status: Home / Self Care | Payer: Managed Care, Other (non HMO) | Source: Ambulatory Visit | Attending: Obstetrics and Gynecology | Admitting: Obstetrics and Gynecology

## 2021-04-10 ENCOUNTER — Other Ambulatory Visit: Payer: Self-pay

## 2021-04-10 VITALS — BP 118/74 | HR 91 | Ht 64.0 in | Wt 301.0 lb

## 2021-04-10 DIAGNOSIS — Z01419 Encounter for gynecological examination (general) (routine) without abnormal findings: Secondary | ICD-10-CM

## 2021-04-10 DIAGNOSIS — D352 Benign neoplasm of pituitary gland: Secondary | ICD-10-CM | POA: Diagnosis not present

## 2021-04-10 DIAGNOSIS — Z124 Encounter for screening for malignant neoplasm of cervix: Secondary | ICD-10-CM | POA: Insufficient documentation

## 2021-04-10 DIAGNOSIS — Z3009 Encounter for other general counseling and advice on contraception: Secondary | ICD-10-CM

## 2021-04-10 LAB — PROLACTIN: Prolactin: 17.8 ng/mL

## 2021-04-10 NOTE — Patient Instructions (Signed)

## 2021-04-12 ENCOUNTER — Other Ambulatory Visit (INDEPENDENT_AMBULATORY_CARE_PROVIDER_SITE_OTHER): Payer: Self-pay | Admitting: Family Medicine

## 2021-04-12 DIAGNOSIS — E559 Vitamin D deficiency, unspecified: Secondary | ICD-10-CM

## 2021-04-14 ENCOUNTER — Other Ambulatory Visit: Payer: Self-pay

## 2021-04-14 ENCOUNTER — Ambulatory Visit (INDEPENDENT_AMBULATORY_CARE_PROVIDER_SITE_OTHER): Payer: Managed Care, Other (non HMO) | Admitting: Obstetrics and Gynecology

## 2021-04-14 ENCOUNTER — Encounter: Payer: Self-pay | Admitting: Obstetrics and Gynecology

## 2021-04-14 VITALS — BP 136/84 | HR 113 | Ht 64.0 in | Wt 303.0 lb

## 2021-04-14 DIAGNOSIS — Z30432 Encounter for removal of intrauterine contraceptive device: Secondary | ICD-10-CM | POA: Diagnosis not present

## 2021-04-14 DIAGNOSIS — Z30017 Encounter for initial prescription of implantable subdermal contraceptive: Secondary | ICD-10-CM

## 2021-04-14 DIAGNOSIS — Z3009 Encounter for other general counseling and advice on contraception: Secondary | ICD-10-CM

## 2021-04-14 NOTE — Patient Instructions (Signed)
Nexplanon Instructions After Insertion  Keep bandage clean and dry for 24 hours  May use ice/Tylenol/Ibuprofen for soreness or pain  If you develop fever, drainage or increased warmth from incision site-contact office immediately  Use back up contraception for one week

## 2021-04-14 NOTE — Progress Notes (Signed)
GYNECOLOGY  VISIT   HPI: 25 y.o.   Married White or Caucasian Not Hispanic or Latino  female   [id number] with Patient's last menstrual period was 04/08/2021.   here for IUD removal and nexplanon insertion.  GYNECOLOGIC HISTORY: Patient's last menstrual period was 04/08/2021. Contraception:IUD Menopausal hormone therapy: NA        OB History     Gravida  0   Para  0   Term  0   Preterm  0   AB  0   Living  0      SAB  0   IAB  0   Ectopic  0   Multiple  0   Live Births  0              Patient Active Problem List   Diagnosis Date Noted   Insulin resistance 10/22/2020   Anemia    IUD (intrauterine device) in place 06/13/2019   Hypertriglyceridemia 06/13/2019   Morbid obesity with BMI of 45.0-49.9, adult ([place]) 08/09/2018   Lactose intolerance 08/09/2018   Vitamin D deficiency 08/09/2018   History of pituitary adenoma ([facility]) 05/20/2017   ADD (attention deficit disorder), Rx Adderall 05/20/2017   Current smoker 02/27/2017   Depression 02/25/2017   Anxiety 02/25/2017    Past Medical History:  Diagnosis Date   ADHD    Anemia    Anxiety    Back pain    Chronic headache    Current smoker    Depression    Fatty liver    GERD (gastroesophageal reflux disease)    Hypertension    IBS (irritable bowel syndrome)    Obesity    Other fatigue    Shortness of breath on exertion    Urticaria    Vitamin D deficiency     History reviewed. No pertinent surgical history.  Current Outpatient Medications  Medication Sig Dispense Refill   amphetamine-dextroamphetamine (ADDERALL XR) 10 MG 24 hr capsule Take 10 mg by mouth daily.     ARIPiprazole (ABILIFY) 2 MG tablet TAKE 1 TABLET (2 MG) BY MOUTH DAILY 90 tablet 2   aspirin-acetaminophen-caffeine (EXCEDRIN MIGRAINE) 250-250-65 MG tablet Take 1 tablet by mouth as needed for headache.     calcium carbonate (TUMS EX) 750 MG chewable tablet Chew 1 tablet by mouth as needed for heartburn.     cetirizine (ZYRTEC) 10  MG tablet Take 1 tablet (10 mg total) by mouth as needed for allergies. Take 1 tablet by mouth daily as needed for seasonal and/or animal related allergies. 30 tablet 5   diphenhydrAMINE (SOMINEX) 25 MG tablet Take 25 mg by mouth as needed for sleep. Take 1 tablet by mouth as needed for seasonal allergies and/or animal related allergies.     EPINEPHrine 0.3 mg/0.3 mL IJ SOAJ injection SMARTSIG:1 Pre-Filled Pen Syringe IM PRN     famotidine (PEPCID) 20 MG tablet Take 1 tablet (20 mg total) by mouth 2 (two) times daily. 60 tablet 5   FLUoxetine (PROZAC) 10 MG capsule Take 20 capsules by mouth daily.     glycopyrrolate (ROBINUL) 2 MG tablet TAKE 1 TABLET(2 MG) BY MOUTH DAILY WITH BREAKFAST 30 tablet 3   levonorgestrel (MIRENA) 20 MCG/24HR IUD 1 each by Intrauterine route once.     melatonin 3 MG TABS tablet Take 1.5 mg by mouth at bedtime. Take 1/2 tablet (1.'5mg'$ ) total by mouth at bedtime as needed for sleep.     Vitamin D, Ergocalciferol, (DRISDOL) 1.25 MG (50000 UNIT) CAPS capsule  Take 1 capsule (50,000 Units total) by mouth every 7 (seven) days. 4 capsule 0   Wheat Dextrin (BENEFIBER DRINK MIX) PACK Take 1 each by mouth daily.     No current facility-administered medications for this visit.     ALLERGIES: Wellbutrin [bupropion], Banana, Kiwi extract, and Pineapple  Family History  Problem Relation Age of Onset   Hypertrophic cardiomyopathy Mother    Hypertension Mother    Heart disease Mother    Depression Mother    Eating disorder Mother    Obesity Mother    Hyperlipidemia Father    Hypertension Father    Colon polyps Father    Anxiety disorder Father    Alcoholism Father    Breast cancer Maternal Aunt 62   Mental illness Maternal Grandmother    Colon polyps Maternal Grandmother    Stroke Maternal Grandfather    Hyperlipidemia Paternal Grandmother    Cancer Paternal Grandfather        stomach   Depression Other    Anxiety disorder Other    Alcoholism Other    Eating disorder  Other    Obesity Other    Other Neg Hx        galactorrhea   Colon cancer Neg Hx    Esophageal cancer Neg Hx     Social History   Socioeconomic History   Marital status: Married    Spouse name: Aidanlythe   Number of children: 0   Years of education: Not on file   Highest education level: Not on file  Occupational History   Occupation: Freight forwarder: OTHER    Comment: [employer]  Tobacco Use   Smoking status: Former    Packs/day: 1.00    Years: 6.00    Pack years: 6.00    Types: Cigarettes    Start date: 08/24/2011    Quit date: 08/22/2018    Years since quitting: 2.6   Smokeless tobacco: Never  Vaping Use   Vaping Use: Every day   Start date: 08/23/2018   Substances: Nicotine, Flavoring  Substance and Sexual Activity   Alcohol use: Yes    Comment: occasionally   Drug use: Yes    Types: Marijuana    Comment: ocaasionally   Sexual activity: Yes    Partners: Male    Birth control/protection: I.U.D.  Other Topics Concern   Not on file  Social History Narrative   Not on file   Social Determinants of Health   Financial Resource Strain: Not on file  Food Insecurity: Not on file  Transportation Needs: Not on file  Physical Activity: Not on file  Stress: Not on file  Social Connections: Not on file  Intimate Partner Violence: Not on file    Review of Systems  All other systems reviewed and are negative.  PHYSICAL EXAMINATION:    BP 136/84   Pulse (!) 113   Ht '5\' 4"'$  (1.626 m)   Wt (!) 303 lb (137.4 kg)   LMP 04/08/2021   SpO2 99%   BMI 52.01 kg/m     General appearance: alert, cooperative and appears stated age  Pelvic: External genitalia:  no lesions              Urethra:  normal appearing urethra with no masses, tenderness or lesions              Bartholins and Skenes: normal  Vagina: normal appearing vagina with normal color and discharge, no lesions              Cervix: no lesions and IUD string 1 cm, IUD  removed with ringed forceps               Risks of nexplanon insertion were reviewed with the patient and a consent was signed.  The patient was placed in the supine position with her left arm bent at the elbow. 8 and 10 cm proximal to the medial epicondyle and ~3 cm posterior to the sulcus was marked with a pen. The area was cleansed with Hibiclens and injected with 1% lidocaine along the path where the nexplanon would be placed. The nexplanon device was inserted in the usual fashion without difficulty. Slight oozing from the insertion site was stopped with pressure. The device was palpated in place.  The patients arm was cleansed of hibiclens and a steri strip was placed over the incision. A gauze was wrapped around her arm.  She tolerated the procedure well  Instructions for care were discussed.    Chaperone was present for exam.  1. General counseling and advice on female contraception - IUD removal - Insertion of implanon rod  2. Encounter for IUD removal  3. Nexplanon insertion Use back up contraception for one week

## 2021-04-15 ENCOUNTER — Telehealth: Payer: Managed Care, Other (non HMO) | Admitting: Physician Assistant

## 2021-04-15 DIAGNOSIS — N390 Urinary tract infection, site not specified: Secondary | ICD-10-CM | POA: Diagnosis not present

## 2021-04-15 MED ORDER — CEPHALEXIN 500 MG PO CAPS: 500.0000 mg | ORAL_CAPSULE | Freq: Two times a day (BID) | ORAL | 0 refills | Status: AC

## 2021-04-15 NOTE — Progress Notes (Signed)
Virtual Visit Consent   Veronica Gray, you are scheduled for a virtual visit with a [facility] provider today.     Just as with appointments in the office, your consent must be obtained to participate.  Your consent will be active for this visit and any virtual visit you may have with one of our providers in the next 365 days.     If you have a MyChart account, a copy of this consent can be sent to you electronically.  All virtual visits are billed to your insurance company just like a traditional visit in the office.    As this is a virtual visit, video technology does not allow for your provider to perform a traditional examination.  This may limit your provider's ability to fully assess your condition.  If your provider identifies any concerns that need to be evaluated in person or the need to arrange testing (such as labs, EKG, etc.), we will make arrangements to do so.     Although advances in technology are sophisticated, we cannot ensure that it will always work on either your end or our end.  If the connection with a video visit is poor, the visit may have to be switched to a telephone visit.  With either a video or telephone visit, we are not always able to ensure that we have a secure connection.     I need to obtain your verbal consent now.   Are you willing to proceed with your visit today?    Veronica Gray has provided verbal consent on 04/15/2021 for a virtual visit (video or telephone).   Veronica Gray, [place]   Date: 04/15/2021 4:29 PM   Virtual Visit via Video Note   I, Veronica Gray, connected with  Veronica Gray  ([id number], 1995-10-13) on 04/15/21 at  4:30 PM EDT by a video-enabled telemedicine application and verified that I am speaking with the correct person using two identifiers.  Location: Patient: Virtual Visit Location Patient: Home Provider: Virtual Visit Location Provider: Home Office   I discussed the limitations of evaluation and management by  telemedicine and the availability of in person appointments. The patient expressed understanding and agreed to proceed.    History of Present Illness: Veronica Gray is a 25 y.o. who identifies as a female who was assigned female at birth, and is being seen today for possible UTI. Patient endorses 3 days of urinary urgency, frequency and dysuria. Denies fever, chills, N/V/D, flank pain or pelvic pain. Is currently on menstrual period. Denies concern for STI. Has been taking AZO to help with symptoms. [name]  HPI: HPI  Problems:  Patient Active Problem List   Diagnosis Date Noted   Insulin resistance 10/22/2020   Anemia    IUD (intrauterine device) in place 06/13/2019   Hypertriglyceridemia 06/13/2019   Morbid obesity with BMI of 45.0-49.9, adult ([place]) 08/09/2018   Lactose intolerance 08/09/2018   Vitamin D deficiency 08/09/2018   History of pituitary adenoma ([place]) 05/20/2017   ADD (attention deficit disorder), Rx Adderall 05/20/2017   Current smoker 02/27/2017   Depression 02/25/2017   Anxiety 02/25/2017    Allergies:  Allergies  Allergen Reactions   Wellbutrin [Bupropion]     uncontrollable shakes and insomnia    Banana    Kiwi Extract    Pineapple    Medications:  Current Outpatient Medications:    cephALEXin (KEFLEX) 500 MG capsule, Take 1 capsule (500 mg total) by mouth 2 (two) times daily for 7 days.,  Disp: 14 capsule, Rfl: 0   amphetamine-dextroamphetamine (ADDERALL XR) 10 MG 24 hr capsule, Take 10 mg by mouth daily., Disp: , Rfl:    ARIPiprazole (ABILIFY) 2 MG tablet, TAKE 1 TABLET (2 MG) BY MOUTH DAILY, Disp: 90 tablet, Rfl: 2   aspirin-acetaminophen-caffeine (EXCEDRIN MIGRAINE) 250-250-65 MG tablet, Take 1 tablet by mouth as needed for headache., Disp: , Rfl:    calcium carbonate (TUMS EX) 750 MG chewable tablet, Chew 1 tablet by mouth as needed for heartburn., Disp: , Rfl:    cetirizine (ZYRTEC) 10 MG tablet, Take 1 tablet (10 mg total) by mouth as needed for allergies. Take  1 tablet by mouth daily as needed for seasonal and/or animal related allergies., Disp: 30 tablet, Rfl: 5   diphenhydrAMINE (SOMINEX) 25 MG tablet, Take 25 mg by mouth as needed for sleep. Take 1 tablet by mouth as needed for seasonal allergies and/or animal related allergies., Disp: , Rfl:    EPINEPHrine 0.3 mg/0.3 mL IJ SOAJ injection, SMARTSIG:1 Pre-Filled Pen Syringe IM PRN, Disp: , Rfl:    famotidine (PEPCID) 20 MG tablet, Take 1 tablet (20 mg total) by mouth 2 (two) times daily., Disp: 60 tablet, Rfl: 5   FLUoxetine (PROZAC) 10 MG capsule, Take 20 capsules by mouth daily., Disp: , Rfl:    glycopyrrolate (ROBINUL) 2 MG tablet, TAKE 1 TABLET(2 MG) BY MOUTH DAILY WITH BREAKFAST, Disp: 30 tablet, Rfl: 3   levonorgestrel (MIRENA) 20 MCG/24HR IUD, 1 each by Intrauterine route once., Disp: , Rfl:    melatonin 3 MG TABS tablet, Take 1.5 mg by mouth at bedtime. Take 1/2 tablet (1.'5mg'$ ) total by mouth at bedtime as needed for sleep., Disp: , Rfl:    Vitamin D, Ergocalciferol, (DRISDOL) 1.25 MG (50000 UNIT) CAPS capsule, Take 1 capsule (50,000 Units total) by mouth every 7 (seven) days., Disp: 4 capsule, Rfl: 0   Wheat Dextrin (BENEFIBER DRINK MIX) PACK, Take 1 each by mouth daily., Disp: , Rfl:   Observations/Objective: Patient is well-developed, well-nourished in no acute distress.  Resting comfortably at home.  Head is normocephalic, atraumatic.  No labored breathing. Speech is clear and coherent with logical content.  Patient is alert and oriented at baseline.   Assessment and Plan: 1. UTI (urinary tract infection), uncomplicated - cephALEXin (KEFLEX) 500 MG capsule; Take 1 capsule (500 mg total) by mouth 2 (two) times daily for 7 days.  Dispense: 14 capsule; Refill: 0 Mild symptoms. No alarm signs/symptoms warranting in person evaluation at this time. Will empirically treat for simple cystitis with Keflex 500 mg BID x 7 days. Supportive measures and OTC medications reviewed.   Follow Up  Instructions: I discussed the assessment and treatment plan with the patient. The patient was provided an opportunity to ask questions and all were answered. The patient agreed with the plan and demonstrated an understanding of the instructions.  A copy of instructions were sent to the patient via MyChart.  The patient was advised to call back or seek an in-person evaluation if the symptoms worsen or if the condition fails to improve as anticipated.  Time:  I spent 15 minutes with the patient via telehealth technology discussing the above problems/concerns.    Veronica Rio, PA-C

## 2021-04-15 NOTE — Patient Instructions (Signed)
Veronica Gray, thank you for joining [name], PA-C for today's virtual visit.  While this provider is not your primary care provider (PCP), if your PCP is located in our provider database this encounter information will be shared with them immediately following your visit.  Consent: (Patient) Veronica Gray provided verbal consent for this virtual visit at the beginning of the encounter.  Current Medications:  Current Outpatient Medications:    amphetamine-dextroamphetamine (ADDERALL XR) 10 MG 24 hr capsule, Take 10 mg by mouth daily., Disp: , Rfl:    ARIPiprazole (ABILIFY) 2 MG tablet, TAKE 1 TABLET (2 MG) BY MOUTH DAILY, Disp: 90 tablet, Rfl: 2   aspirin-acetaminophen-caffeine (EXCEDRIN MIGRAINE) 250-250-65 MG tablet, Take 1 tablet by mouth as needed for headache., Disp: , Rfl:    calcium carbonate (TUMS EX) 750 MG chewable tablet, Chew 1 tablet by mouth as needed for heartburn., Disp: , Rfl:    cetirizine (ZYRTEC) 10 MG tablet, Take 1 tablet (10 mg total) by mouth as needed for allergies. Take 1 tablet by mouth daily as needed for seasonal and/or animal related allergies., Disp: 30 tablet, Rfl: 5   diphenhydrAMINE (SOMINEX) 25 MG tablet, Take 25 mg by mouth as needed for sleep. Take 1 tablet by mouth as needed for seasonal allergies and/or animal related allergies., Disp: , Rfl:    EPINEPHrine 0.3 mg/0.3 mL IJ SOAJ injection, SMARTSIG:1 Pre-Filled Pen Syringe IM PRN, Disp: , Rfl:    famotidine (PEPCID) 20 MG tablet, Take 1 tablet (20 mg total) by mouth 2 (two) times daily., Disp: 60 tablet, Rfl: 5   FLUoxetine (PROZAC) 10 MG capsule, Take 20 capsules by mouth daily., Disp: , Rfl:    glycopyrrolate (ROBINUL) 2 MG tablet, TAKE 1 TABLET(2 MG) BY MOUTH DAILY WITH BREAKFAST, Disp: 30 tablet, Rfl: 3   levonorgestrel (MIRENA) 20 MCG/24HR IUD, 1 each by Intrauterine route once., Disp: , Rfl:    melatonin 3 MG TABS tablet, Take 1.5 mg by mouth at bedtime. Take 1/2 tablet (1.'5mg'$ ) total by  mouth at bedtime as needed for sleep., Disp: , Rfl:    Vitamin D, Ergocalciferol, (DRISDOL) 1.25 MG (50000 UNIT) CAPS capsule, Take 1 capsule (50,000 Units total) by mouth every 7 (seven) days., Disp: 4 capsule, Rfl: 0   Wheat Dextrin (BENEFIBER DRINK MIX) PACK, Take 1 each by mouth daily., Disp: , Rfl:    Medications ordered in this encounter:  No orders of the defined types were placed in this encounter.    *If you need refills on other medications prior to your next appointment, please contact your pharmacy*  Follow-Up: Call back or seek an in-person evaluation if the symptoms worsen or if the condition fails to improve as anticipated.  Other Instructions Your symptoms are consistent with a bladder infection, also called acute cystitis. Please take your antibiotic (Keflex) as directed until all pills are gone.  Stay very well hydrated.  Consider a daily probiotic (Align, Culturelle, or Activia) to help prevent stomach upset caused by the antibiotic.  Taking a probiotic daily may also help prevent recurrent UTIs.  Also consider taking AZO (Phenazopyridine) tablets to help decrease pain with urination.   Urinary Tract Infection A urinary tract infection (UTI) can occur any place along the urinary tract. The tract includes the kidneys, ureters, bladder, and urethra. A type of germ called bacteria often causes a UTI. UTIs are often helped with antibiotic medicine.  HOME CARE  If given, take antibiotics as told by your doctor. Finish them even if  you start to feel better. Drink enough fluids to keep your pee (urine) clear or pale yellow. Avoid tea, drinks with caffeine, and bubbly (carbonated) drinks. Pee often. Avoid holding your pee in for a long time. Pee before and after having sex (intercourse). Wipe from front to back after you poop (bowel movement) if you are a woman. Use each tissue only once. GET HELP RIGHT AWAY IF:  You have back pain. You have lower belly (abdominal) pain. You  have chills. You feel sick to your stomach (nauseous). You throw up (vomit). Your burning or discomfort with peeing does not go away. You have a fever. Your symptoms are not better in 3 days. MAKE SURE YOU:  Understand these instructions. Will watch your condition. Will get help right away if you are not doing well or get worse. Document Released: 01/26/2008 Document Revised: 05/03/2012 Document Reviewed: 03/09/2012 [facility] Patient Information 2015 [place], [place]. This information is not intended to replace advice given to you by your health care provider. Make sure you discuss any questions you have with your health care provider.    If you have been instructed to have an in-person evaluation today at a local Urgent Care facility, please use the link below. It will take you to a list of all of our available [facility], including address, phone number and hours of operation. Please do not delay care.  [facility]  If you or a family member do not have a primary care provider, use the link below to schedule a visit and establish care. When you choose a [facility] primary care physician or advanced practice provider, you gain a long-term partner in health. Find a Primary Care Provider  Learn more about [facility]'s in-office and virtual care options: [facility]

## 2021-04-17 LAB — CYTOLOGY - PAP: Diagnosis: NEGATIVE

## 2021-04-29 ENCOUNTER — Encounter (INDEPENDENT_AMBULATORY_CARE_PROVIDER_SITE_OTHER): Payer: Self-pay | Admitting: Family Medicine

## 2021-04-29 ENCOUNTER — Other Ambulatory Visit: Payer: Self-pay

## 2021-04-29 ENCOUNTER — Ambulatory Visit (INDEPENDENT_AMBULATORY_CARE_PROVIDER_SITE_OTHER): Payer: Managed Care, Other (non HMO) | Admitting: Family Medicine

## 2021-04-29 VITALS — BP 127/83 | HR 99 | Temp 98.0°F | Ht 64.0 in | Wt 289.0 lb

## 2021-04-29 DIAGNOSIS — R7303 Prediabetes: Secondary | ICD-10-CM

## 2021-04-29 DIAGNOSIS — Z9189 Other specified personal risk factors, not elsewhere classified: Secondary | ICD-10-CM | POA: Diagnosis not present

## 2021-04-29 DIAGNOSIS — Z6841 Body Mass Index (BMI) 40.0 and over, adult: Secondary | ICD-10-CM

## 2021-04-29 DIAGNOSIS — E559 Vitamin D deficiency, unspecified: Secondary | ICD-10-CM | POA: Diagnosis not present

## 2021-04-29 MED ORDER — VITAMIN D (ERGOCALCIFEROL) 1.25 MG (50000 UNIT) PO CAPS: 50000.0000 [IU] | ORAL_CAPSULE | ORAL | 0 refills | Status: DC

## 2021-04-30 NOTE — Progress Notes (Signed)
Chief Complaint:   OBESITY Veronica Gray is here to discuss her progress with her obesity treatment plan along with follow-up of her obesity related diagnoses. [name] is on keeping a food journal and adhering to recommended goals of 1650-1800 calories and 125+ grams protein and states she is following her eating plan approximately 85% of the time. [name] states she is riding bike 30 minutes 3 times per week.  Today's visit was #: 7 Starting weight: 309 lbs Starting date: 01/15/2021 Today's weight: 289 lbs Today's date: 04/29/2021 Total lbs lost to date: 20 Total lbs lost since last in-office visit: 6  Interim History: Veronica Gray had a surprise birthday party this past weekend and indulged in a slice of ice cream cake. She is going to [place] a week from today for a music festival. Plan is to [employer] while away. She started therapy last week and is journaling. Pt wants to start writing down food choices.   Subjective:   1. Vitamin D deficiency Veronica Gray denies nausea, vomiting, and muscle weakness but notes fatigue. Pt is on prescription Vit D.  2. Pre-diabetes She is not on medication and has some propensity for carbs.  3. At risk for osteoporosis Veronica Gray is at higher risk of osteopenia and osteoporosis due to Vitamin D deficiency.   Assessment/Plan:   1. Vitamin D deficiency Low Vitamin D level contributes to fatigue and are associated with obesity, breast, and colon cancer. She agrees to continue to take prescription Vitamin D 50,000 IU every week and will follow-up for routine testing of Vitamin D, at least 2-3 times per year to avoid over-replacement.  Refill- Vitamin D, Ergocalciferol, (DRISDOL) 1.25 MG (50000 UNIT) CAPS capsule; Take 1 capsule (50,000 Units total) by mouth every 7 (seven) days.  Dispense: 4 capsule; Refill: 0  2. Pre-diabetes Margarit will continue to work on weight loss, exercise, and decreasing simple carbohydrates to help decrease the risk of diabetes. Repeat  labs in 1.5 months.  3. At risk for osteoporosis Veronica Gray was given approximately 15 minutes of osteoporosis prevention counseling today. Veronica Gray is at risk for osteopenia and osteoporosis due to her Vitamin D deficiency. She was encouraged to take her Vitamin D and follow her higher calcium diet and increase strengthening exercise to help strengthen her bones and decrease her risk of osteopenia and osteoporosis.  Repetitive spaced learning was employed today to elicit superior memory formation and behavioral change.  4. Obesity with current BMI of 49.7  Veronica Gray is currently in the action stage of change. As such, her goal is to continue with weight loss efforts. She has agreed to keeping a food journal and adhering to recommended goals of 1650-1800 calories and 125+ grams protein.   Exercise goals:  As is  Behavioral modification strategies: increasing lean protein intake, meal planning and cooking strategies, keeping healthy foods in the home, and planning for success.  Veronica Gray has agreed to follow-up with our clinic in 2-3 weeks. She was informed of the importance of frequent follow-up visits to maximize her success with intensive lifestyle modifications for her multiple health conditions.   Objective:   Blood pressure 127/83, pulse 99, temperature 98 F (36.7 C), height '5\' 4"'$  (1.626 m), weight 289 lb (131.1 kg), last menstrual period 04/08/2021, SpO2 97 %. Body mass index is 49.61 kg/m.  General: Cooperative, alert, well developed, in no acute distress. HEENT: Conjunctivae and lids unremarkable. Cardiovascular: Regular rhythm.  Lungs: Normal work of breathing. Neurologic: No focal deficits.   Lab Results  Component  Value Date   CREATININE 0.62 10/21/2020   BUN 11 10/21/2020   NA 136 10/21/2020   K 4.0 10/21/2020   CL 99 10/21/2020   CO2 27 10/21/2020   Lab Results  Component Value Date   ALT 53 (H) 12/25/2020   AST 43 (H) 12/25/2020   ALKPHOS 80 12/25/2020   BILITOT 0.3  12/25/2020   Lab Results  Component Value Date   HGBA1C 5.7 10/21/2020   HGBA1C 5.0 (A) 07/13/2019   HGBA1C 5.6 03/16/2019   Lab Results  Component Value Date   INSULIN 15.5 01/15/2021   Lab Results  Component Value Date   TSH 2.35 11/20/2020   Lab Results  Component Value Date   CHOL 164 01/15/2021   HDL 45 01/15/2021   LDLCALC 97 01/15/2021   LDLDIRECT 90.0 07/13/2019   TRIG 121 01/15/2021   CHOLHDL 4 07/13/2019   Lab Results  Component Value Date   VD25OH 21.5 (L) 01/15/2021   Lab Results  Component Value Date   WBC 9.1 10/21/2020   HGB 12.3 10/21/2020   HCT 37.1 10/21/2020   MCV 85.0 10/21/2020   PLT 328.0 10/21/2020   Lab Results  Component Value Date   FERRITIN 54.6 12/25/2020    Attestation Statements:   Reviewed by clinician on day of visit: allergies, medications, problem list, medical history, surgical history, family history, social history, and previous encounter notes.  Coral Ceo, CMA, am acting as transcriptionist for Coralie Common, MD.   I have reviewed the above documentation for accuracy and completeness, and I agree with the above. - Coralie Common, MD

## 2021-05-15 ENCOUNTER — Ambulatory Visit (INDEPENDENT_AMBULATORY_CARE_PROVIDER_SITE_OTHER): Payer: Managed Care, Other (non HMO) | Admitting: Family Medicine

## 2021-05-15 ENCOUNTER — Encounter (INDEPENDENT_AMBULATORY_CARE_PROVIDER_SITE_OTHER): Payer: Self-pay | Admitting: Family Medicine

## 2021-05-15 ENCOUNTER — Other Ambulatory Visit: Payer: Self-pay

## 2021-05-15 VITALS — BP 123/70 | HR 83 | Temp 98.3°F | Ht 64.0 in | Wt 285.0 lb

## 2021-05-15 DIAGNOSIS — Z6841 Body Mass Index (BMI) 40.0 and over, adult: Secondary | ICD-10-CM

## 2021-05-15 DIAGNOSIS — J3089 Other allergic rhinitis: Secondary | ICD-10-CM

## 2021-05-18 NOTE — Progress Notes (Signed)
Chief Complaint:   OBESITY Veronica Gray is here to discuss her progress with her obesity treatment plan along with follow-up of her obesity related diagnoses. Veronica Gray is on keeping a food journal and adhering to recommended goals of 1650-1800 calories and 125+ grams protein and states she is following her eating plan approximately 50% of the time. Veronica Gray states she is walking more and riding a bike to work.  Today's visit was #: 8 Starting weight: 309 lbs Starting date: 01/15/2021 Today's weight: 285 lbs Today's date: 05/15/2021 Total lbs lost to date: 24 Total lbs lost since last in-office visit: 4  Interim History: Veronica Gray just returned from [place] and walked a bit. Her food situation was limited and pt voices she didn't eat much but tried to make nutritious choices when at festival. She returned 4 days ago. Pt is back on plan since returning home. Her biggest obstacle in the next few weeks is remembering to journal.  Subjective:   1. Allergic rhinitis due to other allergic trigger, unspecified seasonality Veronica Gray is on Zyrtec, especially when changing over clothes going into colder months.  Assessment/Plan:   1. Allergic rhinitis due to other allergic trigger, unspecified seasonality Continue Zyrtec as needed.  2. Obesity with current BMI of 49.1  Veronica Gray is currently in the action stage of change. As such, her goal is to continue with weight loss efforts. She has agreed to keeping a food journal and adhering to recommended goals of 1650-1800 calories and 125+ grams protein.   Exercise goals: All adults should avoid inactivity. Some physical activity is better than none, and adults who participate in any amount of physical activity gain some health benefits.  Behavioral modification strategies: increasing lean protein intake, meal planning and cooking strategies, keeping healthy foods in the home, planning for success, and keeping a strict food journal.  Veronica Gray has agreed to  follow-up with our clinic in 3 weeks. She was informed of the importance of frequent follow-up visits to maximize her success with intensive lifestyle modifications for her multiple health conditions.   Objective:   Blood pressure 123/70, pulse 83, temperature 98.3 F (36.8 C), temperature source Oral, height 5\' 4"  (1.626 m), weight 285 lb (129.3 kg), SpO2 95 %. Body mass index is 48.92 kg/m.  General: Cooperative, alert, well developed, in no acute distress. HEENT: Conjunctivae and lids unremarkable. Cardiovascular: Regular rhythm.  Lungs: Normal work of breathing. Neurologic: No focal deficits.   Lab Results  Component Value Date   CREATININE 0.62 10/21/2020   BUN 11 10/21/2020   NA 136 10/21/2020   K 4.0 10/21/2020   CL 99 10/21/2020   CO2 27 10/21/2020   Lab Results  Component Value Date   ALT 53 (H) 12/25/2020   AST 43 (H) 12/25/2020   ALKPHOS 80 12/25/2020   BILITOT 0.3 12/25/2020   Lab Results  Component Value Date   HGBA1C 5.7 10/21/2020   HGBA1C 5.0 (A) 07/13/2019   HGBA1C 5.6 03/16/2019   Lab Results  Component Value Date   INSULIN 15.5 01/15/2021   Lab Results  Component Value Date   TSH 2.35 11/20/2020   Lab Results  Component Value Date   CHOL 164 01/15/2021   HDL 45 01/15/2021   LDLCALC 97 01/15/2021   LDLDIRECT 90.0 07/13/2019   TRIG 121 01/15/2021   CHOLHDL 4 07/13/2019   Lab Results  Component Value Date   VD25OH 21.5 (L) 01/15/2021   Lab Results  Component Value Date   WBC 9.1 10/21/2020  HGB 12.3 10/21/2020   HCT 37.1 10/21/2020   MCV 85.0 10/21/2020   PLT 328.0 10/21/2020   Lab Results  Component Value Date   FERRITIN 54.6 12/25/2020   Attestation Statements:   Reviewed by clinician on day of visit: allergies, medications, problem list, medical history, surgical history, family history, social history, and previous encounter notes.  Time spent on visit including pre-visit chart review and post-visit care and charting was  13 minutes.   Coral Ceo, CMA, am acting as transcriptionist for Coralie Common, MD.   I have reviewed the above documentation for accuracy and completeness, and I agree with the above. - Coralie Common, MD

## 2021-06-08 ENCOUNTER — Ambulatory Visit (INDEPENDENT_AMBULATORY_CARE_PROVIDER_SITE_OTHER): Payer: Managed Care, Other (non HMO) | Admitting: Family Medicine

## 2021-06-22 ENCOUNTER — Other Ambulatory Visit (INDEPENDENT_AMBULATORY_CARE_PROVIDER_SITE_OTHER): Payer: Self-pay | Admitting: Family Medicine

## 2021-06-22 DIAGNOSIS — E559 Vitamin D deficiency, unspecified: Secondary | ICD-10-CM

## 2021-06-23 ENCOUNTER — Ambulatory Visit (INDEPENDENT_AMBULATORY_CARE_PROVIDER_SITE_OTHER): Payer: Managed Care, Other (non HMO) | Admitting: Family Medicine

## 2021-06-23 ENCOUNTER — Other Ambulatory Visit: Payer: Self-pay

## 2021-06-23 ENCOUNTER — Encounter (INDEPENDENT_AMBULATORY_CARE_PROVIDER_SITE_OTHER): Payer: Self-pay | Admitting: Family Medicine

## 2021-06-23 VITALS — BP 114/79 | HR 89 | Temp 97.7°F | Ht 64.0 in | Wt 282.0 lb

## 2021-06-23 DIAGNOSIS — Z6841 Body Mass Index (BMI) 40.0 and over, adult: Secondary | ICD-10-CM

## 2021-06-23 DIAGNOSIS — E559 Vitamin D deficiency, unspecified: Secondary | ICD-10-CM

## 2021-06-23 DIAGNOSIS — R7303 Prediabetes: Secondary | ICD-10-CM | POA: Diagnosis not present

## 2021-06-23 MED ORDER — VITAMIN D (ERGOCALCIFEROL) 1.25 MG (50000 UNIT) PO CAPS: 50000.0000 [IU] | ORAL_CAPSULE | ORAL | 0 refills | Status: DC

## 2021-06-24 NOTE — Progress Notes (Signed)
Chief Complaint:   OBESITY Veronica Gray is here to discuss her progress with her obesity treatment plan along with follow-up of her obesity related diagnoses. Veronica Gray is on the Category 4 Plan and keeping a food journal and adhering to recommended goals of 1650-1800 calories and 125+ grams protein and states she is following her eating plan approximately 50% of the time. Veronica Gray states she is walking 10,000 steps 5 times per week.  Today's visit was #: 9 Starting weight: 309 lbs Starting date: 01/15/2021 Today's weight: 282 lbs Today's date: 06/23/2021 Total lbs lost to date: 27 Total lbs lost since last in-office visit: 3  Interim History: Veronica Gray had a hectic few weeks- her brother-in-law, his boyfriend, and their dog moved in and her husband is moving to [place] for work. She has gotten back into the habit of cooking everyday and trying to have flexibility of journaling for dinner. She only has plans for work over the holidays.  Subjective:   1. Vitamin D deficiency Veronica Gray is taking prescription Vit D. Her last Vit D level was 21.5.  2. Pre-diabetes Pt's last A1c was 5.7 and insulin level 15.5. She is not on medication.  Assessment/Plan:   1. Vitamin D deficiency Low Vitamin D level contributes to fatigue and are associated with obesity, breast, and colon cancer. She agrees to continue to take prescription Vitamin D 50,000 IU every week and will follow-up for routine testing of Vitamin D, at least 2-3 times per year to avoid over-replacement.  Refill- Vitamin D, Ergocalciferol, (DRISDOL) 1.25 MG (50000 UNIT) CAPS capsule; Take 1 capsule (50,000 Units total) by mouth every 7 (seven) days.  Dispense: 4 capsule; Refill: 0  2. Pre-diabetes Veronica Gray will continue to work on weight loss, exercise, and decreasing simple carbohydrates to help decrease the risk of diabetes. Check labs at next appt.  3. Obesity BMI today 34  Veronica Gray is currently in the action stage of change. As such,  her goal is to continue with weight loss efforts. She has agreed to the Category 4 Plan and keeping a food journal and adhering to recommended goals of 1650-1800 calories and 125+ grams protein.   Exercise goals: All adults should avoid inactivity. Some physical activity is better than none, and adults who participate in any amount of physical activity gain some health benefits.  Behavioral modification strategies: increasing lean protein intake, meal planning and cooking strategies, keeping healthy foods in the home, and planning for success.  Veronica Gray has agreed to follow-up with our clinic in 3 weeks, fasting. She was informed of the importance of frequent follow-up visits to maximize her success with intensive lifestyle modifications for her multiple health conditions.   Objective:   Blood pressure 114/79, pulse 89, temperature 97.7 F (36.5 C), height 5\' 4"  (1.626 m), weight 282 lb (127.9 kg), last menstrual period 05/23/2021, SpO2 97 %. Body mass index is 48.41 kg/m.  General: Cooperative, alert, well developed, in no acute distress. HEENT: Conjunctivae and lids unremarkable. Cardiovascular: Regular rhythm.  Lungs: Normal work of breathing. Neurologic: No focal deficits.   Lab Results  Component Value Date   CREATININE 0.62 10/21/2020   BUN 11 10/21/2020   NA 136 10/21/2020   K 4.0 10/21/2020   CL 99 10/21/2020   CO2 27 10/21/2020   Lab Results  Component Value Date   ALT 53 (H) 12/25/2020   AST 43 (H) 12/25/2020   ALKPHOS 80 12/25/2020   BILITOT 0.3 12/25/2020   Lab Results  Component Value  Date   HGBA1C 5.7 10/21/2020   HGBA1C 5.0 (A) 07/13/2019   HGBA1C 5.6 03/16/2019   Lab Results  Component Value Date   INSULIN 15.5 01/15/2021   Lab Results  Component Value Date   TSH 2.35 11/20/2020   Lab Results  Component Value Date   CHOL 164 01/15/2021   HDL 45 01/15/2021   LDLCALC 97 01/15/2021   LDLDIRECT 90.0 07/13/2019   TRIG 121 01/15/2021   CHOLHDL 4  07/13/2019   Lab Results  Component Value Date   VD25OH 21.5 (L) 01/15/2021   Lab Results  Component Value Date   WBC 9.1 10/21/2020   HGB 12.3 10/21/2020   HCT 37.1 10/21/2020   MCV 85.0 10/21/2020   PLT 328.0 10/21/2020   Lab Results  Component Value Date   FERRITIN 54.6 12/25/2020    Attestation Statements:   Reviewed by clinician on day of visit: allergies, medications, problem list, medical history, surgical history, family history, social history, and previous encounter notes.  Time spent on visit including pre-visit chart review and post-visit care and charting was 25 minutes.   Coral Ceo, CMA, am acting as transcriptionist for Coralie Common, MD.  I have reviewed the above documentation for accuracy and completeness, and I agree with the above. - Coralie Common, MD

## 2021-07-14 ENCOUNTER — Encounter (INDEPENDENT_AMBULATORY_CARE_PROVIDER_SITE_OTHER): Payer: Self-pay | Admitting: Family Medicine

## 2021-07-14 ENCOUNTER — Other Ambulatory Visit: Payer: Self-pay

## 2021-07-14 ENCOUNTER — Ambulatory Visit (INDEPENDENT_AMBULATORY_CARE_PROVIDER_SITE_OTHER): Payer: Managed Care, Other (non HMO) | Admitting: Family Medicine

## 2021-07-14 VITALS — BP 116/80 | HR 84 | Temp 98.1°F | Ht 64.0 in | Wt 280.0 lb

## 2021-07-14 DIAGNOSIS — E559 Vitamin D deficiency, unspecified: Secondary | ICD-10-CM | POA: Diagnosis not present

## 2021-07-14 DIAGNOSIS — Z6841 Body Mass Index (BMI) 40.0 and over, adult: Secondary | ICD-10-CM | POA: Diagnosis not present

## 2021-07-14 DIAGNOSIS — R7303 Prediabetes: Secondary | ICD-10-CM

## 2021-07-14 MED ORDER — VITAMIN D (ERGOCALCIFEROL) 1.25 MG (50000 UNIT) PO CAPS: 50000.0000 [IU] | ORAL_CAPSULE | ORAL | 0 refills | Status: DC

## 2021-07-14 NOTE — Progress Notes (Signed)
Chief Complaint:   OBESITY Veronica Gray is here to discuss her progress with her obesity treatment plan along with follow-up of her obesity related diagnoses. Veronica Gray is on keeping a food journal and adhering to recommended goals of 1650-1800 calories and 125+ grams protein and states she is following her eating plan approximately 85% of the time. Veronica Gray states she is doing 10,000 steps and  7 minute workouts 2-3 times per week.  Today's visit was #: 10 Starting weight: 309 lbs Starting date: 01/15/2021 Today's weight: 280 lbs Today's date: 07/14/2021 Total lbs lost to date: 29 Total lbs lost since last in-office visit: 2  Interim History: Veronica Gray has had a good last few weeks. She does category 4 for breakfast and lunch, then cooking using recipes for supper. On Thanksgiving day, she and her husband are going to be driving to [place]. She has a few parties throughout December. Her biggest obstacle in the next few weeks is overeating due to her husband moving to [place].  Subjective:   1. Vitamin D deficiency Pt denies nausea, vomiting, and muscle weakness but notes fatigue. Her last Vit D level was 21.5.  2. Pre-diabetes Veronica Gray's last A1c was 5.7 and insulin level 15. She is not on medication.  Assessment/Plan:   1. Vitamin D deficiency Low Vitamin D level contributes to fatigue and are associated with obesity, breast, and colon cancer. She agrees to continue to take prescription Vitamin D 50,000 IU every week and will follow-up for routine testing of Vitamin D, at least 2-3 times per year to avoid over-replacement.  Refill- Vitamin D, Ergocalciferol, (DRISDOL) 1.25 MG (50000 UNIT) CAPS capsule; Take 1 capsule (50,000 Units total) by mouth every 7 (seven) days.  Dispense: 4 capsule; Refill: 0  2. Pre-diabetes Veronica Gray will continue to work on weight loss, exercise, and decreasing simple carbohydrates to help decrease the risk of diabetes. Continue mixture of category 4 and  journaling.  3. Obesity BMI today is 74  Veronica Gray is currently in the action stage of change. As such, her goal is to continue with weight loss efforts. She has agreed to the Category 4 Plan and keeping a food journal and adhering to recommended goals of 1650-1800 calories and 125+ grams protein.   Exercise goals: All adults should avoid inactivity. Some physical activity is better than none, and adults who participate in any amount of physical activity gain some health benefits.  Behavioral modification strategies: increasing lean protein intake, meal planning and cooking strategies, travel eating strategies, and holiday eating strategies .  Veronica Gray has agreed to follow-up with our clinic in 3 weeks. She was informed of the importance of frequent follow-up visits to maximize her success with intensive lifestyle modifications for her multiple health conditions.   Objective:   Blood pressure 116/80, pulse 84, temperature 98.1 F (36.7 C), height 5\' 4"  (1.626 m), weight 280 lb (127 kg), last menstrual period 07/06/2021, SpO2 98 %. Body mass index is 48.06 kg/m.  General: Cooperative, alert, well developed, in no acute distress. HEENT: Conjunctivae and lids unremarkable. Cardiovascular: Regular rhythm.  Lungs: Normal work of breathing. Neurologic: No focal deficits.   Lab Results  Component Value Date   CREATININE 0.62 10/21/2020   BUN 11 10/21/2020   NA 136 10/21/2020   K 4.0 10/21/2020   CL 99 10/21/2020   CO2 27 10/21/2020   Lab Results  Component Value Date   ALT 53 (H) 12/25/2020   AST 43 (H) 12/25/2020   ALKPHOS 80 12/25/2020  BILITOT 0.3 12/25/2020   Lab Results  Component Value Date   HGBA1C 5.7 10/21/2020   HGBA1C 5.0 (A) 07/13/2019   HGBA1C 5.6 03/16/2019   Lab Results  Component Value Date   INSULIN 15.5 01/15/2021   Lab Results  Component Value Date   TSH 2.35 11/20/2020   Lab Results  Component Value Date   CHOL 164 01/15/2021   HDL 45 01/15/2021    LDLCALC 97 01/15/2021   LDLDIRECT 90.0 07/13/2019   TRIG 121 01/15/2021   CHOLHDL 4 07/13/2019   Lab Results  Component Value Date   VD25OH 21.5 (L) 01/15/2021   Lab Results  Component Value Date   WBC 9.1 10/21/2020   HGB 12.3 10/21/2020   HCT 37.1 10/21/2020   MCV 85.0 10/21/2020   PLT 328.0 10/21/2020   Lab Results  Component Value Date   FERRITIN 54.6 12/25/2020    Attestation Statements:   Reviewed by clinician on day of visit: allergies, medications, problem list, medical history, surgical history, family history, social history, and previous encounter notes.  Coral Ceo, CMA, am acting as transcriptionist for Coralie Common, MD.   I have reviewed the above documentation for accuracy and completeness, and I agree with the above. - Coralie Common, MD

## 2021-08-05 ENCOUNTER — Other Ambulatory Visit: Payer: Self-pay

## 2021-08-05 ENCOUNTER — Ambulatory Visit (INDEPENDENT_AMBULATORY_CARE_PROVIDER_SITE_OTHER): Payer: Managed Care, Other (non HMO) | Admitting: Family Medicine

## 2021-08-05 ENCOUNTER — Encounter (INDEPENDENT_AMBULATORY_CARE_PROVIDER_SITE_OTHER): Payer: Self-pay | Admitting: Family Medicine

## 2021-08-05 VITALS — BP 136/80 | HR 99 | Temp 98.2°F | Ht 64.0 in | Wt 276.0 lb

## 2021-08-05 DIAGNOSIS — R7401 Elevation of levels of liver transaminase levels: Secondary | ICD-10-CM

## 2021-08-05 DIAGNOSIS — E559 Vitamin D deficiency, unspecified: Secondary | ICD-10-CM

## 2021-08-05 DIAGNOSIS — Z6841 Body Mass Index (BMI) 40.0 and over, adult: Secondary | ICD-10-CM | POA: Diagnosis not present

## 2021-08-05 MED ORDER — VITAMIN D (ERGOCALCIFEROL) 1.25 MG (50000 UNIT) PO CAPS: 50000.0000 [IU] | ORAL_CAPSULE | ORAL | 0 refills | Status: DC

## 2021-08-05 NOTE — Progress Notes (Signed)
Chief Complaint:   OBESITY Veronica Gray is here to discuss her progress with her obesity treatment plan along with follow-up of her obesity related diagnoses. [name] is on keeping a food journal and adhering to recommended goals of 1650-1800 calories and 125+ grams protein and states she is following her eating plan approximately 50% of the time. [name] states she is walking the dog 60 minutes 7 times per week.  Today's visit was #: 11 Starting weight: 309 lbs Starting date: 01/15/2021 Today's weight: 276 lbs Today's date: 08/05/2021 Total lbs lost to date: 33 Total lbs lost since last in-office visit: 4  Interim History: [name] did go down to [place] for Thanksgiving. She is eating out more frequently due to current living situation. Christmas will likely be low key at home and then meeting her husband in [place] for New Years. Pt has forgotten to actually journal.  Subjective:   1. Vitamin D deficiency Pt denies nausea, vomiting, and muscle weakness but notes fatigue. She is on prescription Vit D. Her last Vit D level was 21.5.  2. Transaminitis Pt's last LFTs were slightly elevated in May 2022. Ultrasound done in April 2022 showing steatosis.  Assessment/Plan:   1. Vitamin D deficiency Low Vitamin D level contributes to fatigue and are associated with obesity, breast, and colon cancer. She agrees to continue to take prescription Vitamin D 50,000 IU every week and will follow-up for routine testing of Vitamin D, at least 2-3 times per year to avoid over-replacement.  Refill- Vitamin D, Ergocalciferol, (DRISDOL) 1.25 MG (50000 UNIT) CAPS capsule; Take 1 capsule (50,000 Units total) by mouth every 7 (seven) days.  Dispense: 4 capsule; Refill: 0  2. Transaminitis Repeat labs at next appt.  3. Obesity with current BMI of 47.5  [name] is currently in the action stage of change. As such, her goal is to continue with weight loss efforts. She has agreed to keeping a food journal  and adhering to recommended goals of 1650-1800 calories and 125+ grams protein.   Exercise goals:  Revisit consistent physical activity at next appt.  Behavioral modification strategies: increasing lean protein intake, meal planning and cooking strategies, keeping healthy foods in the home, travel eating strategies, holiday eating strategies , and keeping a strict food journal.  [name] has agreed to follow-up with our clinic in 4 weeks, fasting. She was informed of the importance of frequent follow-up visits to maximize her success with intensive lifestyle modifications for her multiple health conditions.   Objective:   Blood pressure 136/80, pulse 99, temperature 98.2 F (36.8 C), height 5\' 4"  (1.626 m), weight 276 lb (125.2 kg), last menstrual period 07/06/2021, SpO2 96 %. Body mass index is 47.38 kg/m.  General: Cooperative, alert, well developed, in no acute distress. HEENT: Conjunctivae and lids unremarkable. Cardiovascular: Regular rhythm.  Lungs: Normal work of breathing. Neurologic: No focal deficits.   Lab Results  Component Value Date   CREATININE 0.62 10/21/2020   BUN 11 10/21/2020   NA 136 10/21/2020   K 4.0 10/21/2020   CL 99 10/21/2020   CO2 27 10/21/2020   Lab Results  Component Value Date   ALT 53 (H) 12/25/2020   AST 43 (H) 12/25/2020   ALKPHOS 80 12/25/2020   BILITOT 0.3 12/25/2020   Lab Results  Component Value Date   HGBA1C 5.7 10/21/2020   HGBA1C 5.0 (A) 07/13/2019   HGBA1C 5.6 03/16/2019   Lab Results  Component Value Date   INSULIN 15.5 01/15/2021   Lab  Results  Component Value Date   TSH 2.35 11/20/2020   Lab Results  Component Value Date   CHOL 164 01/15/2021   HDL 45 01/15/2021   LDLCALC 97 01/15/2021   LDLDIRECT 90.0 07/13/2019   TRIG 121 01/15/2021   CHOLHDL 4 07/13/2019   Lab Results  Component Value Date   VD25OH 21.5 (L) 01/15/2021   Lab Results  Component Value Date   WBC 9.1 10/21/2020   HGB 12.3 10/21/2020   HCT  37.1 10/21/2020   MCV 85.0 10/21/2020   PLT 328.0 10/21/2020   Lab Results  Component Value Date   FERRITIN 54.6 12/25/2020    Attestation Statements:   Reviewed by clinician on day of visit: allergies, medications, problem list, medical history, surgical history, family history, social history, and previous encounter notes.  Coral Ceo, CMA, am acting as transcriptionist for Coralie Common, MD.  I have reviewed the above documentation for accuracy and completeness, and I agree with the above. - Coralie Common, MD

## 2021-09-02 ENCOUNTER — Other Ambulatory Visit: Payer: Self-pay

## 2021-09-02 ENCOUNTER — Encounter (INDEPENDENT_AMBULATORY_CARE_PROVIDER_SITE_OTHER): Payer: Self-pay | Admitting: Family Medicine

## 2021-09-02 ENCOUNTER — Ambulatory Visit (INDEPENDENT_AMBULATORY_CARE_PROVIDER_SITE_OTHER): Payer: Managed Care, Other (non HMO) | Admitting: Family Medicine

## 2021-09-02 VITALS — BP 116/72 | HR 90 | Temp 97.7°F | Ht 64.0 in | Wt 271.0 lb

## 2021-09-02 DIAGNOSIS — E559 Vitamin D deficiency, unspecified: Secondary | ICD-10-CM

## 2021-09-02 DIAGNOSIS — Z6841 Body Mass Index (BMI) 40.0 and over, adult: Secondary | ICD-10-CM

## 2021-09-02 DIAGNOSIS — R946 Abnormal results of thyroid function studies: Secondary | ICD-10-CM | POA: Diagnosis not present

## 2021-09-02 DIAGNOSIS — R7401 Elevation of levels of liver transaminase levels: Secondary | ICD-10-CM

## 2021-09-02 DIAGNOSIS — R7303 Prediabetes: Secondary | ICD-10-CM | POA: Diagnosis not present

## 2021-09-02 DIAGNOSIS — E669 Obesity, unspecified: Secondary | ICD-10-CM

## 2021-09-02 MED ORDER — VITAMIN D (ERGOCALCIFEROL) 1.25 MG (50000 UNIT) PO CAPS: 50000.0000 [IU] | ORAL_CAPSULE | ORAL | 0 refills | Status: DC

## 2021-09-03 LAB — COMPREHENSIVE METABOLIC PANEL
ALT: 26 IU/L (ref 0–32)
AST: 27 IU/L (ref 0–40)
Albumin/Globulin Ratio: 1.4 (ref 1.2–2.2)
Albumin: 4.4 g/dL (ref 3.9–5.0)
Alkaline Phosphatase: 105 IU/L (ref 44–121)
BUN/Creatinine Ratio: 15 (ref 9–23)
BUN: 11 mg/dL (ref 6–20)
Bilirubin Total: 0.4 mg/dL (ref 0.0–1.2)
CO2: 23 mmol/L (ref 20–29)
Calcium: 10 mg/dL (ref 8.7–10.2)
Chloride: 99 mmol/L (ref 96–106)
Creatinine, Ser: 0.75 mg/dL (ref 0.57–1.00)
Globulin, Total: 3.1 g/dL (ref 1.5–4.5)
Glucose: 81 mg/dL (ref 70–99)
Potassium: 4.8 mmol/L (ref 3.5–5.2)
Sodium: 136 mmol/L (ref 134–144)
Total Protein: 7.5 g/dL (ref 6.0–8.5)
eGFR: 112 mL/min/{1.73_m2} (ref >59)

## 2021-09-03 LAB — HEMOGLOBIN A1C
Est. average glucose Bld gHb Est-mCnc: 105 mg/dL
Hgb A1c MFr Bld: 5.3 % (ref 4.8–5.6)

## 2021-09-03 LAB — THYROID PANEL WITH TSH
Free Thyroxine Index: 3.3 (ref 1.2–4.9)
T3 Uptake Ratio: 31 % (ref 24–39)
T4, Total: 10.5 ug/dL (ref 4.5–12.0)
TSH: 1.65 u[IU]/mL (ref 0.450–4.500)

## 2021-09-03 LAB — INSULIN, RANDOM: INSULIN: 17.3 u[IU]/mL (ref 2.6–24.9)

## 2021-09-03 LAB — VITAMIN D 25 HYDROXY (VIT D DEFICIENCY, FRACTURES): Vit D, 25-Hydroxy: 40.3 ng/mL (ref 30.0–100.0)

## 2021-09-03 NOTE — Progress Notes (Signed)
Chief Complaint:   OBESITY Veronica Gray is here to discuss her progress with her obesity treatment plan along with follow-up of her obesity related diagnoses. Veronica Gray is on keeping a food journal and adhering to recommended goals of 1650-1800 calories and 125 grams protein and states she is following her eating plan approximately 75% of the time. Veronica Gray states she is going to the [facility] and walking the dog 25-60 minutes 2-3 times per week.  Today's visit was #: 12 Starting weight: 309 lbs Starting date: 01/15/2021 Today's weight: 271 lbs Today's date: 09/02/2021 Total lbs lost to date: 38 Total lbs lost since last in-office visit: 5  Interim History: Pt joined the [employer] last week and went 3 times last week. She has had increased time demands at work. She has had some indulgent eating with Christmas candy. Pt hasn't been as specific with journaling/logging. She is hoping to be able to use the kitchen in the next few weeks.  Subjective:   1. Prediabetes Pt's last A1c was 5.7 with an insulin level of 15.5. She is not on meds.  2. Transaminitis Her last LFT's were slightly elevated.  3. Vitamin D deficiency Pt is on Rx Vit D. Her last Vit D level was 21.5.  4. Abnormal thyroid function test Pt's last T3 was elevated at 217 (normal 71-180).  Assessment/Plan:   1. Prediabetes [name] will continue to work on weight loss, exercise, and decreasing simple carbohydrates to help decrease the risk of diabetes. Check labs today.  - Hemoglobin A1c - Insulin, random  2. Transaminitis Check labs today.  - Comprehensive metabolic panel  3. Vitamin D deficiency Low Vitamin D level contributes to fatigue and are associated with obesity, breast, and colon cancer. She agrees to continue to take prescription Vitamin D 50,000 IU every week and will follow-up for routine testing of Vitamin D, at least 2-3 times per year to avoid over-replacement. Check labs today.  Refill- Vitamin D,  Ergocalciferol, (DRISDOL) 1.25 MG (50000 UNIT) CAPS capsule; Take 1 capsule (50,000 Units total) by mouth every 7 (seven) days.  Dispense: 4 capsule; Refill: 0  - VITAMIN D 25 Hydroxy (Vit-D Deficiency, Fractures)  4. Abnormal thyroid function test Check labs today.  - Thyroid Panel With TSH  5. Obesity with current BMI of 46.7  Roux is currently in the action stage of change. As such, her goal is to continue with weight loss efforts. She has agreed to keeping a food journal and adhering to recommended goals of 1650-1800 calories and 125+ grams protein.   Exercise goals: All adults should avoid inactivity. Some physical activity is better than none, and adults who participate in any amount of physical activity gain some health benefits.  Behavioral modification strategies: increasing lean protein intake, meal planning and cooking strategies, and keeping healthy foods in the home.  [name] has agreed to follow-up with our clinic in 3 weeks. She was informed of the importance of frequent follow-up visits to maximize her success with intensive lifestyle modifications for her multiple health conditions.   Objective:   Blood pressure 116/72, pulse 90, temperature 97.7 F (36.5 C), height 5\' 4"  (1.626 m), weight 271 lb (122.9 kg), last menstrual period 08/19/2021, SpO2 97 %. Body mass index is 46.52 kg/m.  General: Cooperative, alert, well developed, in no acute distress. HEENT: Conjunctivae and lids unremarkable. Cardiovascular: Regular rhythm.  Lungs: Normal work of breathing. Neurologic: No focal deficits.   Lab Results  Component Value Date   CREATININE 0.75 09/02/2021  BUN 11 09/02/2021   NA 136 09/02/2021   K 4.8 09/02/2021   CL 99 09/02/2021   CO2 23 09/02/2021   Lab Results  Component Value Date   ALT 26 09/02/2021   AST 27 09/02/2021   ALKPHOS 105 09/02/2021   BILITOT 0.4 09/02/2021   Lab Results  Component Value Date   HGBA1C 5.3 09/02/2021   HGBA1C 5.7  10/21/2020   HGBA1C 5.0 (A) 07/13/2019   HGBA1C 5.6 03/16/2019   Lab Results  Component Value Date   INSULIN 17.3 09/02/2021   INSULIN 15.5 01/15/2021   Lab Results  Component Value Date   TSH 1.650 09/02/2021   Lab Results  Component Value Date   CHOL 164 01/15/2021   HDL 45 01/15/2021   LDLCALC 97 01/15/2021   LDLDIRECT 90.0 07/13/2019   TRIG 121 01/15/2021   CHOLHDL 4 07/13/2019   Lab Results  Component Value Date   VD25OH 40.3 09/02/2021   VD25OH 21.5 (L) 01/15/2021   Lab Results  Component Value Date   WBC 9.1 10/21/2020   HGB 12.3 10/21/2020   HCT 37.1 10/21/2020   MCV 85.0 10/21/2020   PLT 328.0 10/21/2020   Lab Results  Component Value Date   FERRITIN 54.6 12/25/2020     Attestation Statements:   Reviewed by clinician on day of visit: allergies, medications, problem list, medical history, surgical history, family history, social history, and previous encounter notes.  Coral Ceo, CMA, am acting as transcriptionist for Coralie Common, MD.   I have reviewed the above documentation for accuracy and completeness, and I agree with the above. - Coralie Common, MD

## 2021-09-23 ENCOUNTER — Encounter (INDEPENDENT_AMBULATORY_CARE_PROVIDER_SITE_OTHER): Payer: Self-pay | Admitting: Family Medicine

## 2021-09-23 ENCOUNTER — Other Ambulatory Visit: Payer: Self-pay

## 2021-09-23 ENCOUNTER — Ambulatory Visit (INDEPENDENT_AMBULATORY_CARE_PROVIDER_SITE_OTHER): Payer: Managed Care, Other (non HMO) | Admitting: Family Medicine

## 2021-09-23 VITALS — BP 101/68 | HR 100 | Temp 97.9°F | Ht 64.0 in | Wt 271.0 lb

## 2021-09-23 DIAGNOSIS — Z6841 Body Mass Index (BMI) 40.0 and over, adult: Secondary | ICD-10-CM

## 2021-09-23 DIAGNOSIS — E559 Vitamin D deficiency, unspecified: Secondary | ICD-10-CM

## 2021-09-23 DIAGNOSIS — E669 Obesity, unspecified: Secondary | ICD-10-CM

## 2021-09-23 DIAGNOSIS — R7303 Prediabetes: Secondary | ICD-10-CM | POA: Diagnosis not present

## 2021-09-23 MED ORDER — VITAMIN D (ERGOCALCIFEROL) 1.25 MG (50000 UNIT) PO CAPS: 50000.0000 [IU] | ORAL_CAPSULE | ORAL | 0 refills | Status: DC

## 2021-09-24 NOTE — Progress Notes (Signed)
Chief Complaint:   OBESITY Veronica Gray is here to discuss her progress with her obesity treatment plan along with follow-up of her obesity related diagnoses. Ruble is on keeping a food journal and adhering to recommended goals of 1650-1800 calories and 125+ grams protein and states she is following her eating plan approximately 85% of the time. Alysia states she is walking or treadmill 20-30 minutes 1 times per week.  Today's visit was #: 75 Starting weight: 309 lbs Starting date: 01/15/2021 Today's weight: 271 lbs Today's date: 09/23/2021 Total lbs lost to date: 38 Total lbs lost since last in-office visit: 0  Interim History: Pt has been adjusting to living alone for the first time. Pt has struggled with exercise, as she doesn't want to leave the house. She tends to gravitate toward cardio. She is doing microwave meals mostly in calorie count. Pt hasn't been aware of protein content. She is going to [facility] for Valentine's Day.  Subjective:   1. Vitamin D deficiency Pt is on Rx Vit D and her last level was 40.3.  2. Prediabetes Pt's last A1c was 5.3 with an insulin level of 17.3. She is not on meds.  Assessment/Plan:   1. Vitamin D deficiency Low Vitamin D level contributes to fatigue and are associated with obesity, breast, and colon cancer. She agrees to continue to take prescription Vitamin D 50,000 IU every week and will follow-up for routine testing of Vitamin D, at least 2-3 times per year to avoid over-replacement.  Refill- Vitamin D, Ergocalciferol, (DRISDOL) 1.25 MG (50000 UNIT) CAPS capsule; Take 1 capsule (50,000 Units total) by mouth every 7 (seven) days.  Dispense: 4 capsule; Refill: 0  2. Prediabetes At goal. Jessly will continue to work on weight loss, exercise, and decreasing simple carbohydrates to help decrease the risk of diabetes. Continue journaling.  3. Obesity with current BMI of 46.6 Kyarra is currently in the action stage of change. As such, her  goal is to continue with weight loss efforts. She has agreed to keeping a food journal and adhering to recommended goals of 1650-1800 calories and 125+ grams protein.   Exercise goals:  Do 5-10 minutes of resistance training 2-3 times a week.  Behavioral modification strategies: increasing lean protein intake, meal planning and cooking strategies, keeping healthy foods in the home, and avoiding temptations.  Shanese has agreed to follow-up with our clinic in 3-4 weeks. She was informed of the importance of frequent follow-up visits to maximize her success with intensive lifestyle modifications for her multiple health conditions.   Objective:   Blood pressure 101/68, pulse 100, temperature 97.9 F (36.6 C), height 5\' 4"  (1.626 m), weight 271 lb (122.9 kg), SpO2 96 %. Body mass index is 46.52 kg/m.  General: Cooperative, alert, well developed, in no acute distress. HEENT: Conjunctivae and lids unremarkable. Cardiovascular: Regular rhythm.  Lungs: Normal work of breathing. Neurologic: No focal deficits.   Lab Results  Component Value Date   CREATININE 0.75 09/02/2021   BUN 11 09/02/2021   NA 136 09/02/2021   K 4.8 09/02/2021   CL 99 09/02/2021   CO2 23 09/02/2021   Lab Results  Component Value Date   ALT 26 09/02/2021   AST 27 09/02/2021   ALKPHOS 105 09/02/2021   BILITOT 0.4 09/02/2021   Lab Results  Component Value Date   HGBA1C 5.3 09/02/2021   HGBA1C 5.7 10/21/2020   HGBA1C 5.0 (A) 07/13/2019   HGBA1C 5.6 03/16/2019   Lab Results  Component  Value Date   INSULIN 17.3 09/02/2021   INSULIN 15.5 01/15/2021   Lab Results  Component Value Date   TSH 1.650 09/02/2021   Lab Results  Component Value Date   CHOL 164 01/15/2021   HDL 45 01/15/2021   LDLCALC 97 01/15/2021   LDLDIRECT 90.0 07/13/2019   TRIG 121 01/15/2021   CHOLHDL 4 07/13/2019   Lab Results  Component Value Date   VD25OH 40.3 09/02/2021   VD25OH 21.5 (L) 01/15/2021   Lab Results  Component  Value Date   WBC 9.1 10/21/2020   HGB 12.3 10/21/2020   HCT 37.1 10/21/2020   MCV 85.0 10/21/2020   PLT 328.0 10/21/2020   Lab Results  Component Value Date   FERRITIN 54.6 12/25/2020    Attestation Statements:   Reviewed by clinician on day of visit: allergies, medications, problem list, medical history, surgical history, family history, social history, and previous encounter notes.  Coral Ceo, CMA, am acting as transcriptionist for Coralie Common, MD.  I have reviewed the above documentation for accuracy and completeness, and I agree with the above. - Coralie Common, MD

## 2021-10-14 ENCOUNTER — Ambulatory Visit (INDEPENDENT_AMBULATORY_CARE_PROVIDER_SITE_OTHER): Payer: Managed Care, Other (non HMO) | Admitting: Family Medicine

## 2021-10-14 ENCOUNTER — Other Ambulatory Visit: Payer: Self-pay

## 2021-10-14 ENCOUNTER — Encounter (INDEPENDENT_AMBULATORY_CARE_PROVIDER_SITE_OTHER): Payer: Self-pay | Admitting: Family Medicine

## 2021-10-14 VITALS — BP 127/84 | HR 112 | Temp 98.2°F | Ht 64.0 in | Wt 272.0 lb

## 2021-10-14 DIAGNOSIS — E669 Obesity, unspecified: Secondary | ICD-10-CM

## 2021-10-14 DIAGNOSIS — Z6841 Body Mass Index (BMI) 40.0 and over, adult: Secondary | ICD-10-CM

## 2021-10-14 DIAGNOSIS — F3289 Other specified depressive episodes: Secondary | ICD-10-CM | POA: Diagnosis not present

## 2021-10-14 DIAGNOSIS — E559 Vitamin D deficiency, unspecified: Secondary | ICD-10-CM | POA: Diagnosis not present

## 2021-10-14 MED ORDER — VITAMIN D (ERGOCALCIFEROL) 1.25 MG (50000 UNIT) PO CAPS: 50000.0000 [IU] | ORAL_CAPSULE | ORAL | 0 refills | Status: DC

## 2021-10-14 NOTE — Progress Notes (Signed)
Chief Complaint:   OBESITY Veronica Gray is here to discuss her progress with her obesity treatment plan along with follow-up of her obesity related diagnoses. Veronica Gray is on keeping a food journal and adhering to recommended goals of 1650-1800 calories and 125+ grams protein and states she is following her eating plan approximately 60-70% of the time. Veronica Gray states she is walking and kettlebell 20-40 minutes 3-4 times per week.  Today's visit was #: 14 Starting weight: 309 lbs Starting date: 01/15/2021 Today's weight: 272 lbs Today's date: 10/14/2021 Total lbs lost to date: 37 Total lbs lost since last in-office visit: 0  Interim History: Pt was informed she will be laid off starting May when she moves to [place]. She did have some binge type episodes since learning that. She is trying to get into the habit of applying to new jobs but keep quality of work. Pt wants to get back to journaling. She is going grocery shopping today.  Subjective:   1. Vitamin D deficiency Pt is on Rx Vit D. Her last Vit D level was 40.3.  2. Other depression Pt is not capable of having significant emotional responses on 2 mg Abilify and 20 mg Prozac.  Assessment/Plan:   1. Vitamin D deficiency Low Vitamin D level contributes to fatigue and are associated with obesity, breast, and colon cancer. She agrees to continue to take prescription Vitamin D 50,000 IU every week and will follow-up for routine testing of Vitamin D, at least 2-3 times per year to avoid over-replacement.  Refill- Vitamin D, Ergocalciferol, (DRISDOL) 1.25 MG (50000 UNIT) CAPS capsule; Take 1 capsule (50,000 Units total) by mouth every 7 (seven) days.  Dispense: 4 capsule; Refill: 0  2. Other depression Behavior modification techniques were discussed today to help Veronica Gray deal with her emotional/non-hunger eating behaviors.  Orders and follow up as documented in patient record. F/u with psych March 14.  3. Obesity with current BMI of  46.8 Veronica Gray is currently in the action stage of change. As such, her goal is to continue with weight loss efforts. She has agreed to keeping a food journal and adhering to recommended goals of 1650-1800 calories and 125+ grams protein.   Exercise goals: All adults should avoid inactivity. Some physical activity is better than none, and adults who participate in any amount of physical activity gain some health benefits.  Behavioral modification strategies: increasing lean protein intake, better snacking choices, emotional eating strategies, and keeping a strict food journal.  Veronica Gray has agreed to follow-up with our clinic in 3 weeks. She was informed of the importance of frequent follow-up visits to maximize her success with intensive lifestyle modifications for her multiple health conditions.   Objective:   Blood pressure 127/84, pulse (!) 112, temperature 98.2 F (36.8 C), height 5\' 4"  (1.626 m), weight 272 lb (123.4 kg), SpO2 97 %. Body mass index is 46.69 kg/m.  General: Cooperative, alert, well developed, in no acute distress. HEENT: Conjunctivae and lids unremarkable. Cardiovascular: Regular rhythm.  Lungs: Normal work of breathing. Neurologic: No focal deficits.   Lab Results  Component Value Date   CREATININE 0.75 09/02/2021   BUN 11 09/02/2021   NA 136 09/02/2021   K 4.8 09/02/2021   CL 99 09/02/2021   CO2 23 09/02/2021   Lab Results  Component Value Date   ALT 26 09/02/2021   AST 27 09/02/2021   ALKPHOS 105 09/02/2021   BILITOT 0.4 09/02/2021   Lab Results  Component Value Date  HGBA1C 5.3 09/02/2021   HGBA1C 5.7 10/21/2020   HGBA1C 5.0 (A) 07/13/2019   HGBA1C 5.6 03/16/2019   Lab Results  Component Value Date   INSULIN 17.3 09/02/2021   INSULIN 15.5 01/15/2021   Lab Results  Component Value Date   TSH 1.650 09/02/2021   Lab Results  Component Value Date   CHOL 164 01/15/2021   HDL 45 01/15/2021   LDLCALC 97 01/15/2021   LDLDIRECT 90.0 07/13/2019    TRIG 121 01/15/2021   CHOLHDL 4 07/13/2019   Lab Results  Component Value Date   VD25OH 40.3 09/02/2021   VD25OH 21.5 (L) 01/15/2021   Lab Results  Component Value Date   WBC 9.1 10/21/2020   HGB 12.3 10/21/2020   HCT 37.1 10/21/2020   MCV 85.0 10/21/2020   PLT 328.0 10/21/2020   Lab Results  Component Value Date   FERRITIN 54.6 12/25/2020   Attestation Statements:   Reviewed by clinician on day of visit: allergies, medications, problem list, medical history, surgical history, family history, social history, and previous encounter notes.  Coral Ceo, CMA, am acting as transcriptionist for Coralie Common, MD.  I have reviewed the above documentation for accuracy and completeness, and I agree with the above. - Coralie Common, MD

## 2021-11-09 ENCOUNTER — Ambulatory Visit (INDEPENDENT_AMBULATORY_CARE_PROVIDER_SITE_OTHER): Payer: Managed Care, Other (non HMO) | Admitting: Family Medicine

## 2021-11-09 ENCOUNTER — Encounter (INDEPENDENT_AMBULATORY_CARE_PROVIDER_SITE_OTHER): Payer: Self-pay | Admitting: Family Medicine

## 2021-11-09 ENCOUNTER — Other Ambulatory Visit: Payer: Self-pay

## 2021-11-09 VITALS — BP 118/73 | HR 93 | Temp 98.1°F | Ht 64.0 in | Wt 271.0 lb

## 2021-11-09 DIAGNOSIS — E669 Obesity, unspecified: Secondary | ICD-10-CM

## 2021-11-09 DIAGNOSIS — Z6841 Body Mass Index (BMI) 40.0 and over, adult: Secondary | ICD-10-CM | POA: Diagnosis not present

## 2021-11-09 DIAGNOSIS — E559 Vitamin D deficiency, unspecified: Secondary | ICD-10-CM

## 2021-11-09 DIAGNOSIS — F331 Major depressive disorder, recurrent, moderate: Secondary | ICD-10-CM | POA: Diagnosis not present

## 2021-11-09 MED ORDER — FLUOXETINE HCL 10 MG PO CAPS: 20.0000 mg | ORAL_CAPSULE | Freq: Every day | ORAL | Status: AC

## 2021-11-10 NOTE — Progress Notes (Signed)
? ? ? ?Chief Complaint:  ? ?OBESITY ?[name] is here to discuss her progress with her obesity treatment plan along with follow-up of her obesity related diagnoses. [name] is on keeping a food journal and adhering to recommended goals of 1650-1800 calories and 125+ grams of protein daily and states she is following her eating plan approximately 75% of the time. [name] states she is doing weights, rowing, treadmill, and walking for 30-90 minutes 2-3 times per week. ? ?Today's visit was #: 15 ?Starting weight: 309 lbs ?Starting date: 01/15/2021 ?Today's weight: 271 lbs ?Today's date: 11/09/2021 ?Total lbs lost to date: 41 ?Total lbs lost since last in-office visit: 1 ? ?Interim History: Jaely reports the last few weeks she has been trying to stay on an exercise routine. She feels that both journaling and exercising has been difficult. She is not sure if her  ADD is contributing to this. The next few weeks she has a lot of move related activities to do. ? ?Subjective:  ? ?1. Vitamin D deficiency ?[name] has quite a bit of Vit D at home. She denies nausea, vomiting, or muscle weakness. ? ?2. Moderate episode of recurrent major depressive disorder ([place]) ?[name] is on Abilify and Prozac. She just saw her Psychiatrist.  ? ?Assessment/Plan:  ? ?1. Vitamin D deficiency ?[name] will continue prescription Vitamin D 50,000 IU weekly. ? ?2. Moderate episode of recurrent major depressive disorder ([place]) ?We will refill Prozac for 1 month. [name] will follow up with her Psychiatrist for further medication management. ? ?- FLUoxetine (PROZAC) 10 MG capsule; Take 2 capsules (20 mg total) by mouth daily. ? ?3. Obesity with current BMI of 46.6 ?[name] is currently in the action stage of change. As such, her goal is to continue with weight loss efforts. She has agreed to keeping a food journal and adhering to recommended goals of 1650-1800 calories and 125+ grams of protein daily.  ? ?Exercise goals: As is. ? ?Behavioral  modification strategies: increasing lean protein intake, meal planning and cooking strategies, keeping healthy foods in the home, and planning for success. ? ?[name] has agreed to follow-up with our clinic in 4 weeks. She was informed of the importance of frequent follow-up visits to maximize her success with intensive lifestyle modifications for her multiple health conditions.  ? ?Objective:  ? ?Blood pressure 118/73, pulse 93, temperature 98.1 ?F (36.7 ?C), height '5\' 4"'$  (1.626 m), weight 271 lb (122.9 kg), last menstrual period 11/05/2021, SpO2 96 %. ?Body mass index is 46.52 kg/m?. ? ?General: Cooperative, alert, well developed, in no acute distress. ?HEENT: Conjunctivae and lids unremarkable. ?Cardiovascular: Regular rhythm.  ?Lungs: Normal work of breathing. ?Neurologic: No focal deficits.  ? ?Lab Results  ?Component Value Date  ? CREATININE 0.75 09/02/2021  ? BUN 11 09/02/2021  ? NA 136 09/02/2021  ? K 4.8 09/02/2021  ? CL 99 09/02/2021  ? CO2 23 09/02/2021  ? ?Lab Results  ?Component Value Date  ? ALT 26 09/02/2021  ? AST 27 09/02/2021  ? ALKPHOS 105 09/02/2021  ? BILITOT 0.4 09/02/2021  ? ?Lab Results  ?Component Value Date  ? HGBA1C 5.3 09/02/2021  ? HGBA1C 5.7 10/21/2020  ? HGBA1C 5.0 (A) 07/13/2019  ? HGBA1C 5.6 03/16/2019  ? ?Lab Results  ?Component Value Date  ? INSULIN 17.3 09/02/2021  ? INSULIN 15.5 01/15/2021  ? ?Lab Results  ?Component Value Date  ? TSH 1.650 09/02/2021  ? ?Lab Results  ?Component Value Date  ? CHOL 164 01/15/2021  ? HDL 45  01/15/2021  ? [place] 97 01/15/2021  ? LDLDIRECT 90.0 07/13/2019  ? TRIG 121 01/15/2021  ? CHOLHDL 4 07/13/2019  ? ?Lab Results  ?Component Value Date  ? VD25OH 40.3 09/02/2021  ? VD25OH 21.5 (L) 01/15/2021  ? ?Lab Results  ?Component Value Date  ? WBC 9.1 10/21/2020  ? HGB 12.3 10/21/2020  ? HCT 37.1 10/21/2020  ? MCV 85.0 10/21/2020  ? PLT 328.0 10/21/2020  ? ?Lab Results  ?Component Value Date  ? FERRITIN 54.6 12/25/2020  ? ?Attestation Statements:   ? ?Reviewed by clinician on day of visit: allergies, medications, problem list, medical history, surgical history, family history, social history, and previous encounter notes. ? ? ?I, Trixie Dredge, am acting as transcriptionist for Coralie Common, MD. ? ?I have reviewed the above documentation for accuracy and completeness, and I agree with the above. Coralie Common, MD ? ? ?

## 2021-12-07 ENCOUNTER — Ambulatory Visit (INDEPENDENT_AMBULATORY_CARE_PROVIDER_SITE_OTHER): Payer: Managed Care, Other (non HMO) | Admitting: Family Medicine

## 2021-12-07 ENCOUNTER — Encounter (INDEPENDENT_AMBULATORY_CARE_PROVIDER_SITE_OTHER): Payer: Self-pay | Admitting: Family Medicine

## 2021-12-07 VITALS — BP 127/74 | HR 98 | Temp 98.4°F | Ht 64.0 in | Wt 276.0 lb

## 2021-12-07 DIAGNOSIS — R7401 Elevation of levels of liver transaminase levels: Secondary | ICD-10-CM | POA: Diagnosis not present

## 2021-12-07 DIAGNOSIS — Z6841 Body Mass Index (BMI) 40.0 and over, adult: Secondary | ICD-10-CM | POA: Diagnosis not present

## 2021-12-07 DIAGNOSIS — E559 Vitamin D deficiency, unspecified: Secondary | ICD-10-CM | POA: Diagnosis not present

## 2021-12-07 MED ORDER — VITAMIN D (ERGOCALCIFEROL) 1.25 MG (50000 UNIT) PO CAPS: 50000.0000 [IU] | ORAL_CAPSULE | ORAL | 0 refills | Status: AC

## 2021-12-18 ENCOUNTER — Other Ambulatory Visit: Payer: Self-pay | Admitting: Physician Assistant

## 2021-12-18 NOTE — Progress Notes (Signed)
Chief Complaint:   OBESITY Veronica Gray is here to discuss her progress with her obesity treatment plan along with follow-up of her obesity related diagnoses. Veronica Gray is on keeping a food journal and adhering to recommended goals of 1650 to 1800 calories and 125 grams of protein and states she is following her eating plan approximately 45 to 50% of the time. Veronica Gray states she is moving.  Today's visit was #: 54 Starting weight: 309 lbs Starting date: 01/15/2021 Today's weight: 276 lbs Today's date: 12/07/2021 Total lbs lost to date: 33 Total lbs lost since last in-office visit: 0  Interim History: Veronica Gray is moving this upcoming weekend as she got laid off 1.5 weeks ago. She is hoping that once she gets settled in her new apartment, that she will be able to get back on track. Driving from here to her knew apartment is 14 hours.  Subjective:   1. Vitamin D deficiency Veronica Gray is on prescription vitamin D and her last vitamin D level was 40.3. She notes fatigue and denies nausea, vomiting, and muscle weakness.  2. Transaminitis Veronica Gray's last LFT's in January were within normal limits. She is not on liver metabolizing medications.   Assessment/Plan:   1. Vitamin D deficiency Veronica Gray agrees to continue taking vitamin D 50,000IU weekly.  - Vitamin D, Ergocalciferol, (DRISDOL) 1.25 MG (50000 UNIT) CAPS capsule; Take 1 capsule (50,000 Units total) by mouth every 7 (seven) days.  Dispense: 4 capsule; Refill: 0  2. Transaminitis Veronica Gray agrees to establish with a PCP after her move.  Veronica Gray is currently in the action stage of change. As such, her goal is to continue with weight loss efforts. She has agreed to keeping a food journal and adhering to recommended goals of 1650 to 1800 calories and 125+ grams of protein.   Exercise goals: No exercise has been prescribed at this time.  Behavioral modification strategies: increasing lean protein intake, meal planning and cooking strategies,  and keeping healthy foods in the home.   She was informed of the importance of frequent follow-up visits to maximize her success with intensive lifestyle modifications for her multiple health conditions.   Objective:   Blood pressure 127/74, pulse 98, temperature 98.4 F (36.9 C), height '5\' 4"'$  (1.626 m), weight 276 lb (125.2 kg), SpO2 97 %. Body mass index is 47.38 kg/m.  General: Cooperative, alert, well developed, in no acute distress. HEENT: Conjunctivae and lids unremarkable. Cardiovascular: Regular rhythm.  Lungs: Normal work of breathing. Neurologic: No focal deficits.   Lab Results  Component Value Date   CREATININE 0.75 09/02/2021   BUN 11 09/02/2021   NA 136 09/02/2021   K 4.8 09/02/2021   CL 99 09/02/2021   CO2 23 09/02/2021   Lab Results  Component Value Date   ALT 26 09/02/2021   AST 27 09/02/2021   ALKPHOS 105 09/02/2021   BILITOT 0.4 09/02/2021   Lab Results  Component Value Date   HGBA1C 5.3 09/02/2021   HGBA1C 5.7 10/21/2020   HGBA1C 5.0 (A) 07/13/2019   HGBA1C 5.6 03/16/2019   Lab Results  Component Value Date   INSULIN 17.3 09/02/2021   INSULIN 15.5 01/15/2021   Lab Results  Component Value Date   TSH 1.650 09/02/2021   Lab Results  Component Value Date   CHOL 164 01/15/2021   HDL 45 01/15/2021   LDLCALC 97 01/15/2021   LDLDIRECT 90.0 07/13/2019   TRIG 121 01/15/2021   CHOLHDL 4 07/13/2019   Lab Results  Component Value  Date   VD25OH 40.3 09/02/2021   VD25OH 21.5 (L) 01/15/2021   Lab Results  Component Value Date   WBC 9.1 10/21/2020   HGB 12.3 10/21/2020   HCT 37.1 10/21/2020   MCV 85.0 10/21/2020   PLT 328.0 10/21/2020   Lab Results  Component Value Date   FERRITIN 54.6 12/25/2020   Attestation Statements:   Reviewed by clinician on day of visit: allergies, medications, problem list, medical history, surgical history, family history, social history, and previous encounter notes.  IMarcille Blanco, CMA, am acting as  transcriptionist for Coralie Common, MD  I have reviewed the above documentation for accuracy and completeness, and I agree with the above. - Coralie Common, MD

## 2022-02-10 ENCOUNTER — Other Ambulatory Visit: Payer: Self-pay | Admitting: Physician Assistant

## 2022-02-11 ENCOUNTER — Other Ambulatory Visit: Payer: Self-pay | Admitting: Physician Assistant

## 2022-02-18 ENCOUNTER — Ambulatory Visit: Payer: Managed Care, Other (non HMO) | Admitting: Allergy

## 2022-03-03 ENCOUNTER — Ambulatory Visit: Payer: Managed Care, Other (non HMO) | Admitting: Allergy

## 2022-03-31 ENCOUNTER — Encounter (INDEPENDENT_AMBULATORY_CARE_PROVIDER_SITE_OTHER): Payer: Self-pay

## 2022-05-17 ENCOUNTER — Encounter: Payer: Self-pay | Admitting: *Deleted

## 2022-08-05 ENCOUNTER — Encounter: Payer: Self-pay | Admitting: *Deleted

## 2023-02-17 IMAGING — US US ABDOMEN COMPLETE
1 series · 14 of 25 positions shown · non-contrast
Comparison: None.

CLINICAL DATA: Elevated LFT

EXAM:
ABDOMEN ULTRASOUND COMPLETE

[Series 1: us abdomen complete · 14 of 82 slices shown]
[im 1/82]
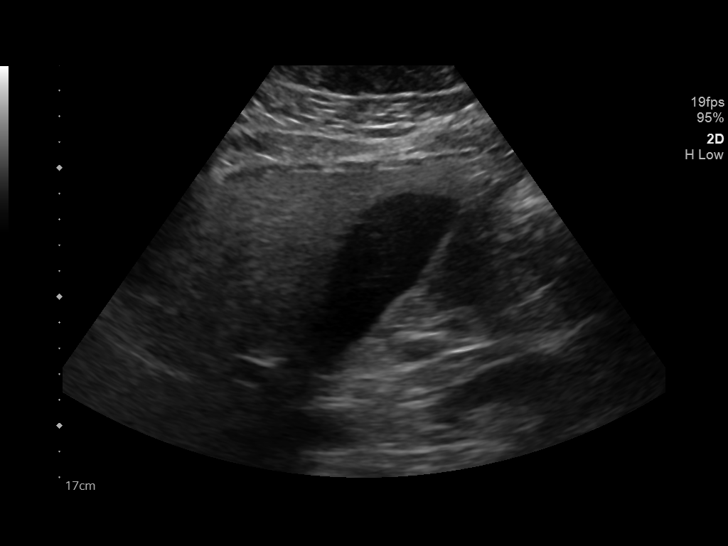
[im 7/82]
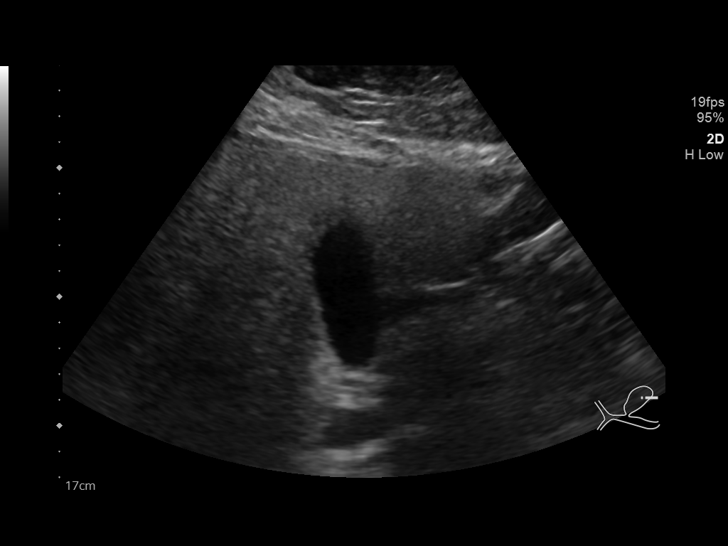
[im 14/82]
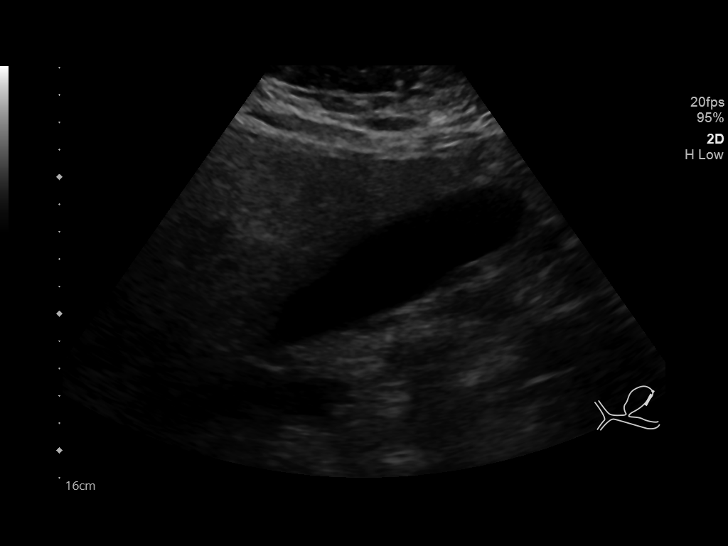
[im 21/82]
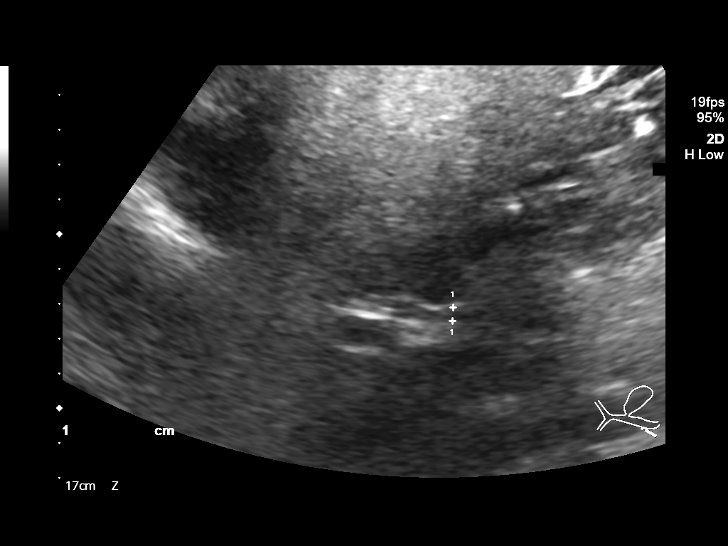
[im 28/82]
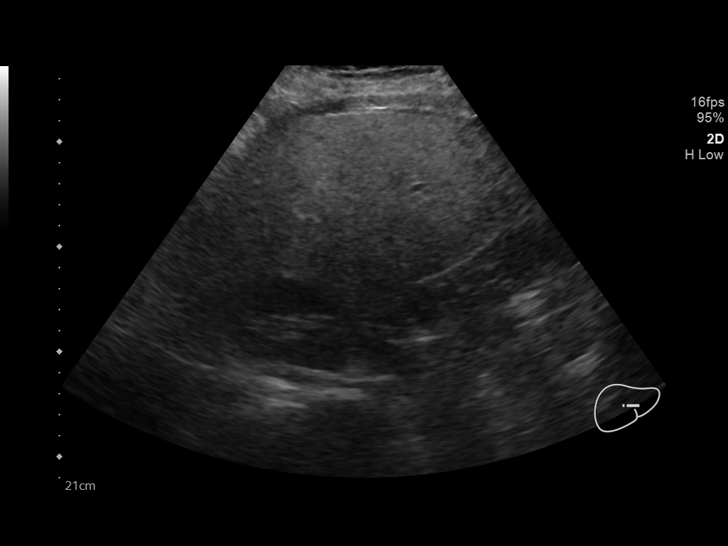
[im 31/82]
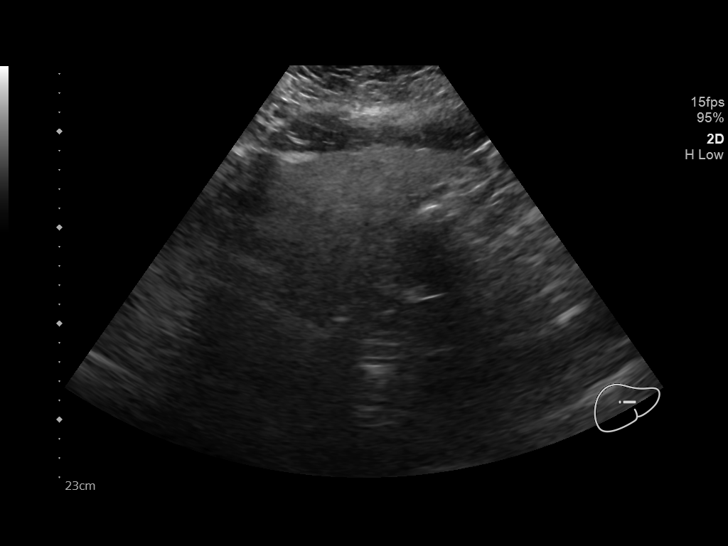
[im 38/82]
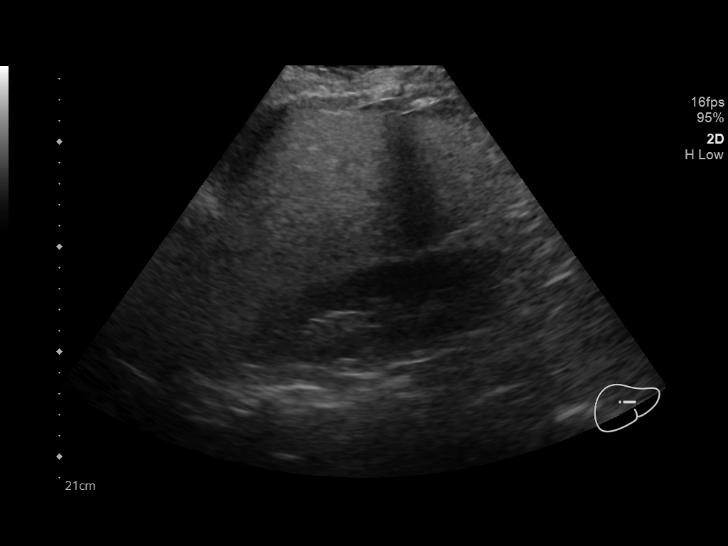
[im 44/82]
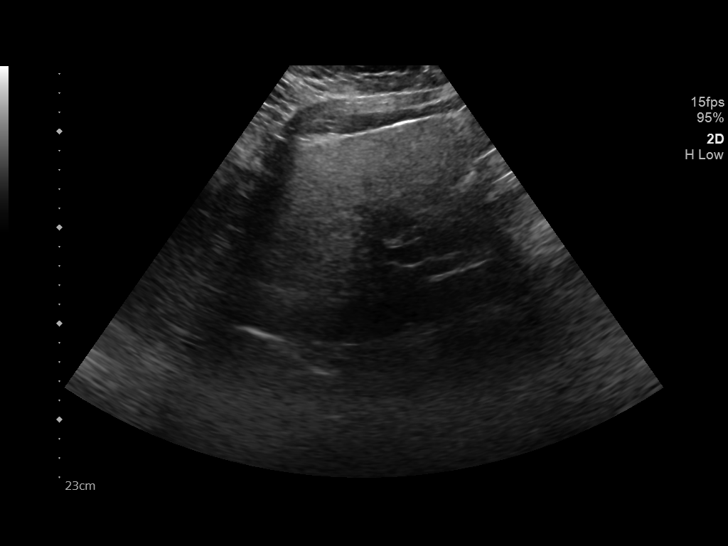
[im 51/82]
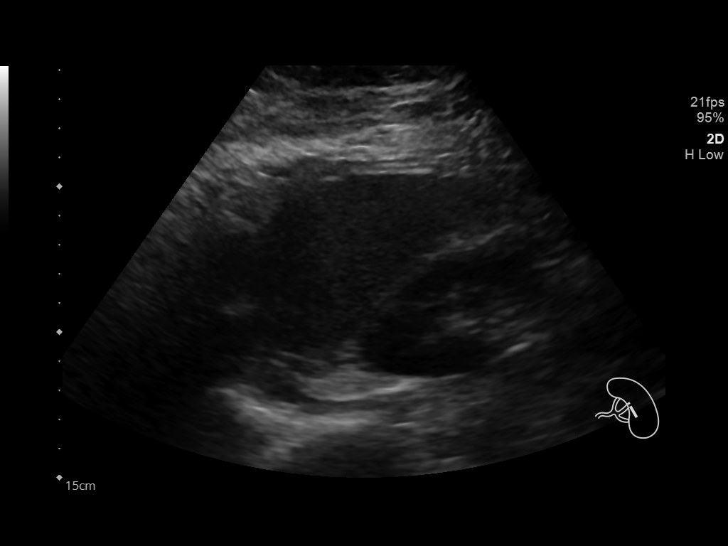
[im 55/82]
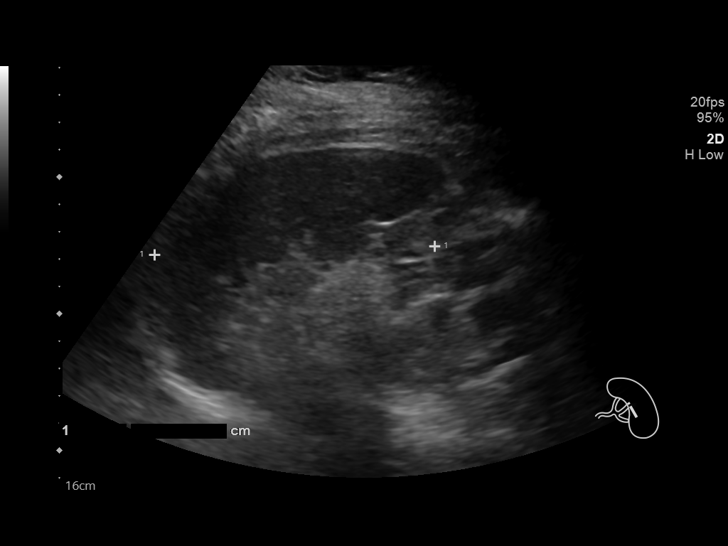
[im 61/82]
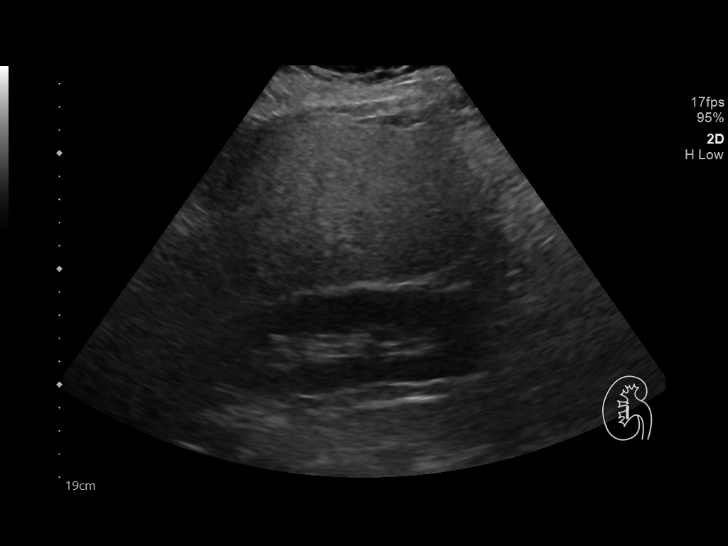
[im 68/82]
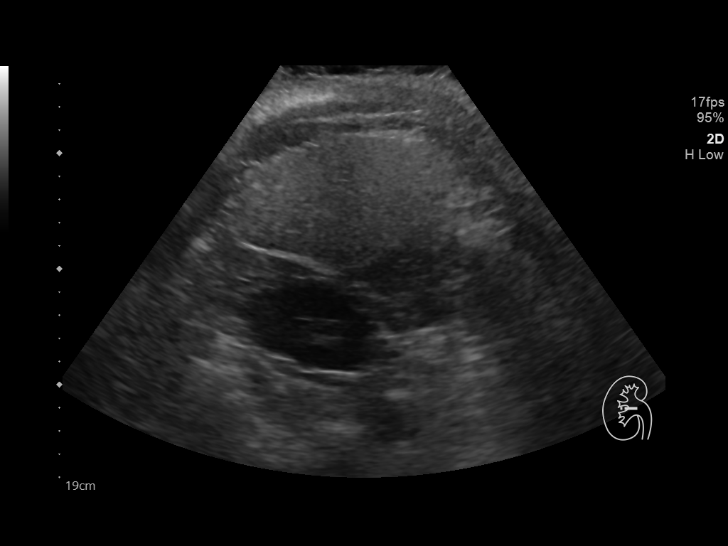
[im 75/82]
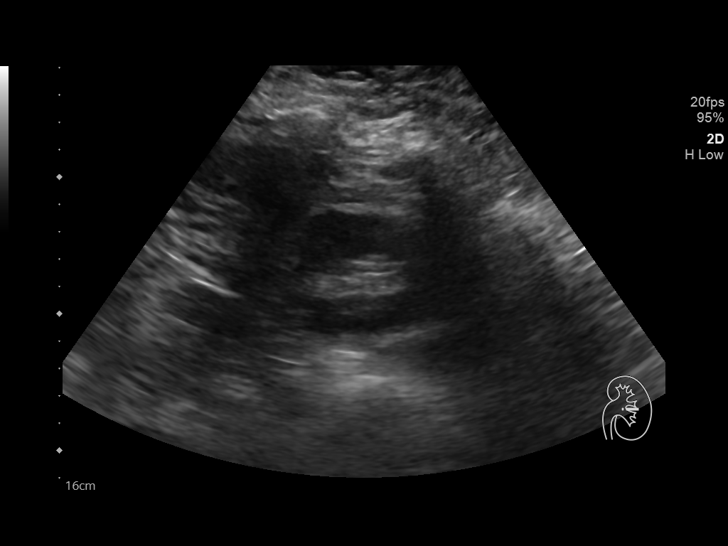
[im 82/82]
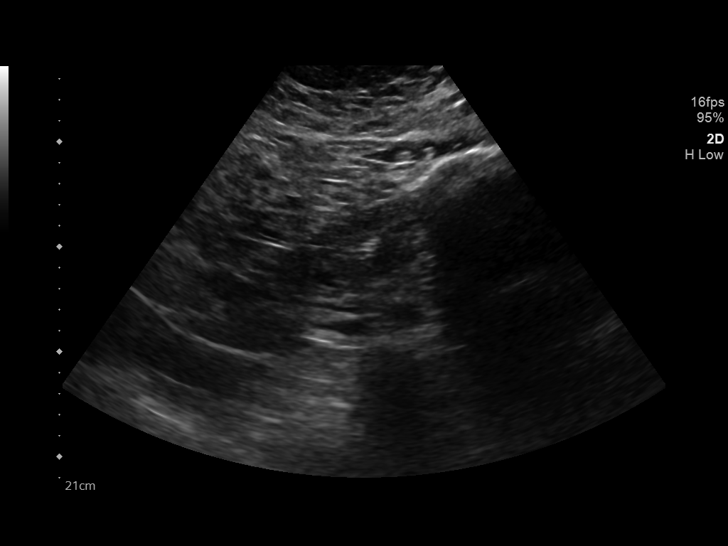

[14 of 25 positions shown; findings below may reference images not displayed]

FINDINGS: Gallbladder: No gallstones or wall thickening visualized. No
sonographic Murphy sign noted by sonographer.

Common bile duct: Diameter: 3.9 mm

Liver: Diffusely echogenic. No focal hepatic abnormality. Portal
vein is patent on color Doppler imaging with normal direction of
blood flow towards the liver.

IVC: No abnormality visualized.

Pancreas: Visualized portion unremarkable.

Spleen: Size and appearance within normal limits.

Right Kidney: Length: 12.7 cm. Echogenicity within normal limits. No
mass or hydronephrosis visualized.

Left Kidney: Length: 13.1 cm. Echogenicity within normal limits. No
mass or hydronephrosis visualized.

Abdominal aorta: No aneurysm visualized.

Other findings: None.
IMPRESSION: 1. Diffusely echogenic liver consistent with hepatic steatosis.
2. Otherwise negative examination
